# Patient Record
Sex: Female | Born: 1975
Health system: Southern US, Community
[De-identification: ages and names within clinical notes are randomized; demographics above are authoritative.]

## PROBLEM LIST (undated history)

## (undated) DIAGNOSIS — I7300 Raynaud's syndrome without gangrene: Secondary | ICD-10-CM

## (undated) DIAGNOSIS — K219 Gastro-esophageal reflux disease without esophagitis: Secondary | ICD-10-CM

## (undated) DIAGNOSIS — J301 Allergic rhinitis due to pollen: Secondary | ICD-10-CM

## (undated) DIAGNOSIS — M419 Scoliosis, unspecified: Secondary | ICD-10-CM

## (undated) DIAGNOSIS — B019 Varicella without complication: Secondary | ICD-10-CM

## (undated) HISTORY — DX: Varicella without complication: B01.9

## (undated) HISTORY — DX: Allergic rhinitis due to pollen: J30.1

## (undated) HISTORY — DX: Raynaud's syndrome without gangrene: I73.00

## (undated) HISTORY — DX: Scoliosis, unspecified: M41.9

## (undated) HISTORY — DX: Gastro-esophageal reflux disease without esophagitis: K21.9

---

## 2007-10-31 HISTORY — PX: NASAL SEPTUM SURGERY: SHX37

## 2018-10-29 ENCOUNTER — Ambulatory Visit: Payer: 59 | Admitting: Family Medicine

## 2018-10-29 ENCOUNTER — Encounter: Payer: Self-pay | Admitting: Family Medicine

## 2018-10-29 VITALS — BP 128/86 | HR 85 | Temp 98.0°F | Ht 61.0 in | Wt 192.4 lb

## 2018-10-29 DIAGNOSIS — Z8349 Family history of other endocrine, nutritional and metabolic diseases: Secondary | ICD-10-CM | POA: Diagnosis not present

## 2018-10-29 DIAGNOSIS — Z0000 Encounter for general adult medical examination without abnormal findings: Secondary | ICD-10-CM | POA: Diagnosis not present

## 2018-10-29 DIAGNOSIS — E6609 Other obesity due to excess calories: Secondary | ICD-10-CM

## 2018-10-29 DIAGNOSIS — Z6836 Body mass index (BMI) 36.0-36.9, adult: Secondary | ICD-10-CM | POA: Insufficient documentation

## 2018-10-29 DIAGNOSIS — E559 Vitamin D deficiency, unspecified: Secondary | ICD-10-CM | POA: Diagnosis not present

## 2018-10-29 NOTE — Progress Notes (Signed)
Subjective:    Patient ID: Gloria SchatzLucinda Dorsey, female    DOB: 10/09/1976, 42 y.o.   MRN: [id number]  HPI  Presents to clinic to establish with PCP.  Overall she is feeling well. She has history of vit D deficiency and would like this level checked  Mammogram is UTD and Pap smear is UTD - she sees GYN  Also states her GYN started her on phentermine to help jumpstart weight loss, she just picked up Rx yesterday and will start taking after the new year.   She sees the eye doctor once every 1 to 2 years, she sees dentist every 6 months.  Currently other than the phentermine she also takes a woman's One-A-Day vitamin and vitamin D3 2000 units daily during the winter months and 1000 units daily during the summer months.  Past medical, surgical, family and social history reviewed and updated in chart: Past Medical History:  Diagnosis Date  . Chicken pox   . GERD (gastroesophageal reflux disease)   . Hay fever    Social History   Tobacco Use  . Smoking status: Former Games developermoker  . Smokeless tobacco: Never Used  Substance Use Topics  . Alcohol use: Never    Frequency: Never   Family History  Problem Relation Age of Onset  . Arthritis Mother   . Asthma Mother   . Diabetes Mother   . Heart disease Mother   . Hypertension Mother   . Arthritis Father   . Heart disease Father   . Hyperlipidemia Father   . Stroke Father   . Alcohol abuse Brother   . Cancer Brother   . COPD Brother   . Arthritis Maternal Grandmother   . Cancer Maternal Grandmother   . Depression Maternal Grandmother   . Diabetes Maternal Grandmother   . Heart disease Maternal Grandmother   . Hyperlipidemia Maternal Grandmother   . Hypertension Maternal Grandmother   . Kidney disease Maternal Grandmother   . Stroke Maternal Grandfather   . Heart disease Paternal Grandmother   . Cancer Paternal Grandfather    Past Surgical History:  Procedure Laterality Date  . NASAL SEPTUM SURGERY  2009    Review of  Systems  Constitutional: Negative for chills, fatigue and fever.  HENT: Negative for congestion, ear pain, sinus pain and sore throat.   Eyes: Negative.   Respiratory: Negative for cough, shortness of breath and wheezing.   Cardiovascular: Negative for chest pain, palpitations and leg swelling.  Gastrointestinal: Negative for abdominal pain, diarrhea, nausea and vomiting.  Genitourinary: Negative for dysuria, frequency and urgency.  Musculoskeletal: Negative for arthralgias and myalgias.  Skin: Negative for color change, pallor and rash.  Neurological: Negative for syncope, light-headedness and headaches.  Psychiatric/Behavioral: The patient is not nervous/anxious.        Objective:   Physical Exam  Constitutional: She appears well-developed and well-nourished. No distress.  HENT:  Head: Normocephalic and atraumatic.  Eyes: Pupils are equal, round, and reactive to light. EOM are normal. No scleral icterus.  Neck: Normal range of motion. Neck supple. No tracheal deviation present.  Cardiovascular: Normal rate, regular rhythm and normal heart sounds.  Pulmonary/Chest: Effort normal and breath sounds normal. No respiratory distress. She has no wheezes. She has no rales.  Abdominal: Soft. Bowel sounds are normal. There is no tenderness.  Neurological: She is alert and oriented to person, place, and time.  Gait normal  Skin: Skin is warm and dry. No pallor.  Psychiatric: She has a normal mood and affect.  Her behavior is normal. Thought content normal.   Nursing note and vitals reviewed.   Today's Vitals   10/29/18 1031  BP: 128/86  Pulse: 85  Temp: 98 F (36.7 C)  TempSrc: Oral  SpO2: 97%  Weight: 192 lb 6.4 oz (87.3 kg)  Height: 5\' 1"  (1.549 m)   Body mass index is 36.35 kg/m.     Assessment & Plan:   Well adult exam/obesity/family history of metabolic and nutritional disorder - patient's Pap smear mammogram up-to-date.  She is working on healthy eating and weight loss  with the help of phentermine, phentermine is prescribed by her GYN.  Also due to her family history of diabetes, heart disease, hyperlipidemia we will be sure to check CBC, CMP, thyroid panel, lipid panel in lab work today.  Vitamin D deficiency - patient has longstanding history of vitamin D deficiency.  Will check vitamin D in clinic today as well as B12 level.  Patient will follow-up here in 1 year for annual exam.  She will be made aware of her lab results when they are available, if lab results require any additional medications or future follow-up those orders will be placed accordingly.  Patient's weight loss and phentermine prescription is managed by her GYN.

## 2018-10-30 LAB — COMPREHENSIVE METABOLIC PANEL
AG Ratio: 2 (calc) (ref 1.0–2.5)
ALT: 14 U/L (ref 6–29)
AST: 11 U/L (ref 10–30)
Albumin: 4.5 g/dL (ref 3.6–5.1)
Alkaline phosphatase (APISO): 69 U/L (ref 33–115)
BUN: 8 mg/dL (ref 7–25)
CO2: 27 mmol/L (ref 20–32)
CREATININE: 0.7 mg/dL (ref 0.50–1.10)
Calcium: 9.7 mg/dL (ref 8.6–10.2)
Chloride: 100 mmol/L (ref 98–110)
GLUCOSE: 91 mg/dL (ref 65–99)
Globulin: 2.2 g/dL (calc) (ref 1.9–3.7)
Potassium: 4.5 mmol/L (ref 3.5–5.3)
Sodium: 137 mmol/L (ref 135–146)
TOTAL PROTEIN: 6.7 g/dL (ref 6.1–8.1)
Total Bilirubin: 0.4 mg/dL (ref 0.2–1.2)

## 2018-10-30 LAB — LIPID PANEL
Cholesterol: 221 mg/dL — ABNORMAL HIGH (ref <200)
HDL: 60 mg/dL (ref >50)
LDL Cholesterol (Calc): 127 mg/dL (calc) — ABNORMAL HIGH
NON-HDL CHOLESTEROL (CALC): 161 mg/dL — AB (ref <130)
TRIGLYCERIDES: 204 mg/dL — AB (ref <150)
Total CHOL/HDL Ratio: 3.7 (calc) (ref <5.0)

## 2018-10-30 LAB — B12 AND FOLATE PANEL
Folate: 10.8 ng/mL
Vitamin B-12: 449 pg/mL (ref 200–1100)

## 2018-10-30 LAB — VITAMIN D 25 HYDROXY (VIT D DEFICIENCY, FRACTURES): Vit D, 25-Hydroxy: 31 ng/mL (ref 30–100)

## 2018-10-30 LAB — THYROID PANEL WITH TSH
Free Thyroxine Index: 2.2 (ref 1.4–3.8)
T3 Uptake: 28 % (ref 22–35)
T4, Total: 7.8 ug/dL (ref 5.1–11.9)
TSH: 2.31 mIU/L

## 2018-10-30 LAB — CBC
HCT: 43 % (ref 35.0–45.0)
Hemoglobin: 15 g/dL (ref 11.7–15.5)
MCH: 32.7 pg (ref 27.0–33.0)
MCHC: 34.9 g/dL (ref 32.0–36.0)
MCV: 93.7 fL (ref 80.0–100.0)
MPV: 8.9 fL (ref 7.5–12.5)
PLATELETS: 293 10*3/uL (ref 140–400)
RBC: 4.59 10*6/uL (ref 3.80–5.10)
RDW: 11.8 % (ref 11.0–15.0)
WBC: 11.7 10*3/uL — ABNORMAL HIGH (ref 3.8–10.8)

## 2019-05-29 ENCOUNTER — Ambulatory Visit (INDEPENDENT_AMBULATORY_CARE_PROVIDER_SITE_OTHER): Payer: 59 | Admitting: Family Medicine

## 2019-05-29 ENCOUNTER — Other Ambulatory Visit: Payer: Self-pay

## 2019-05-29 DIAGNOSIS — R5383 Other fatigue: Secondary | ICD-10-CM | POA: Diagnosis not present

## 2019-05-29 DIAGNOSIS — H6690 Otitis media, unspecified, unspecified ear: Secondary | ICD-10-CM | POA: Diagnosis not present

## 2019-05-29 DIAGNOSIS — R509 Fever, unspecified: Secondary | ICD-10-CM | POA: Diagnosis not present

## 2019-05-29 DIAGNOSIS — Z20822 Contact with and (suspected) exposure to covid-19: Secondary | ICD-10-CM

## 2019-05-29 DIAGNOSIS — R05 Cough: Secondary | ICD-10-CM | POA: Diagnosis not present

## 2019-05-29 DIAGNOSIS — Z20828 Contact with and (suspected) exposure to other viral communicable diseases: Secondary | ICD-10-CM

## 2019-05-29 DIAGNOSIS — R059 Cough, unspecified: Secondary | ICD-10-CM

## 2019-05-29 MED ORDER — AMOXICILLIN-POT CLAVULANATE 875-125 MG PO TABS: 1.0000 | ORAL_TABLET | Freq: Two times a day (BID) | ORAL | 0 refills | Status: DC

## 2019-05-29 NOTE — Progress Notes (Signed)
Patient ID: Gloria Dorsey, female   DOB: [birth year], 43 y.o.   MRN: [id number]    Virtual Visit via video Note  This visit type was conducted due to national recommendations for restrictions regarding the COVID-19 pandemic (e.g. social distancing).  This format is felt to be most appropriate for this patient at this time.  All issues noted in this document were discussed and addressed.  No physical exam was performed (except for noted visual exam findings with Video Visits).   I connected with Gloria Dorsey today at  2:20 PM EDT by a video enabled telemedicine application and verified that I am speaking with the correct person using two identifiers. Location patient: home Location provider: work or home office Persons participating in the virtual visit: patient, provider  I discussed the limitations, risks, security and privacy concerns of performing an evaluation and management service by video and the availability of in person appointments. I also discussed with the patient that there may be a patient responsible charge related to this service. The patient expressed understanding and agreed to proceed.  HPI:  Patient and I connected via video due to ear pain and positive close exposure to COVID-19.  Patient was tested for COVID-19 yesterday due to her husband testing positive.  Also has had pain in ear building up over the past 7 to 10 days.  Usually tries to combat this with allergy medication, has a history of seasonal allergies and ear pain.  Has been taking Allegra and some Sudafed with minimal help in reducing ear pain.  Now ears feel throbbing and "as if they could burst".  Yesterday and today, patient also began to develop fatigue, feeling achy and feverish/chills.  Denies SOB or wheezing, no chest pain, denies N/V/D    ROS: See pertinent positives and negatives per HPI.  Past Medical History:  Diagnosis Date   Chicken pox    GERD (gastroesophageal reflux disease)    Hay  fever     Past Surgical History:  Procedure Laterality Date   NASAL SEPTUM SURGERY  2009    Family History  Problem Relation Age of Onset   Arthritis Mother    Asthma Mother    Diabetes Mother    Heart disease Mother    Hypertension Mother    Arthritis Father    Heart disease Father    Hyperlipidemia Father    Stroke Father    Alcohol abuse Brother    Cancer Brother    COPD Brother    Arthritis Maternal Grandmother    Cancer Maternal Grandmother    Depression Maternal Grandmother    Diabetes Maternal Grandmother    Heart disease Maternal Grandmother    Hyperlipidemia Maternal Grandmother    Hypertension Maternal Grandmother    Kidney disease Maternal Grandmother    Stroke Maternal Grandfather    Heart disease Paternal Grandmother    Cancer Paternal Grandfather      EXAM:  GENERAL: alert, oriented, appears in no acute distress; does seem tired.  HEENT: atraumatic, conjunttiva clear, no obvious abnormalities on inspection of external nose and ears  NECK: normal movements of the head and neck  LUNGS: on inspection no signs of respiratory distress, breathing rate appears normal, no obvious gross SOB, gasping or wheezing  CV: no obvious cyanosis  MS: moves all visible extremities without noticeable abnormality  PSYCH/NEURO: pleasant and cooperative, no obvious depression or anxiety, speech and thought processing grossly intact  ASSESSMENT AND PLAN:  Discussed the following assessment and plan:  +COVID-19  virus infection exposure, fever, chills, fatigue, cough - advised that due to symptoms +close exposure, we need to get patient set up for COVID-19 testing.  Patient advised that I will put order in and he can go to testing location for for drive-through testing. Patient given the address of testing site.  Patient advised that testing is taking 2 to 7 days to result, and while we are awaiting results patient must remain under self quarantine  and monitor for any changing/worsening symptoms.  Advised over-the-counter medications such as Tylenol can be used to help treat pain or fevers, Robitussin can be used to help calm cough, allergy medication such as Claritin or Allegra can help reduce congestion.  Also discussed getting plenty of rest and increasing fluid intake.  Made patient aware that test results as well as how his symptoms progress will determine when the self quarantine will be able to end.  Also advised to monitor self for any worsening symptoms, advised if severe shortness of breath develops, high fever that is not reduced with use of Tylenol, chest pain, severe vomiting or diarrhea  --patient must call on-call and or go to ER right away for evaluation. patient verbalized understanding of these instructions.  Ear pain/infection -due to patient's description of ear pain and concern for possible ear infection.  Advised she can continue allergy medicine that she has been and also we will treat with Augmentin twice daily for 10 days.  Advised that this medication can be tough on this medicine to be sure to eat with food and also eat yogurt and/or daily probiotic to help offset/antibiotic associated gastritis and diarrhea that can occur.   I discussed the assessment and treatment plan with the patient. The patient was provided an opportunity to ask questions and all were answered. The patient agreed with the plan and demonstrated an understanding of the instructions.   The patient was advised to call back or seek an in-person evaluation if the symptoms worsen or if the condition fails to improve as anticipated.  Tracey HarriesLauren M Prinston Kynard, FNP

## 2019-06-01 ENCOUNTER — Telehealth: Payer: Self-pay | Admitting: Family Medicine

## 2019-06-01 LAB — NOVEL CORONAVIRUS, NAA: SARS-CoV-2, NAA: DETECTED — AB

## 2019-06-01 NOTE — Telephone Encounter (Signed)
I was notified today by [facility] that here recent test for the Covid-19 virus was positive. Her husband tested positive last week and no doubt this was the source of her infection. She began to have symptoms on 05-28-19 including headache, body aches, fever to 99. 7 degrees, and nausea and vomiting. She is drinking fluids and she has used some Zofran and Tylenol #3 which she had left over from a dental procedure. Today she feels about the same. She knows to self quarantine for a total of 14 days from the start of her symptoms. She has already spoken to her PCP, Philis Nettle, about this and she has provided her with a work. 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 She will check with her supervisor about this.

## 2019-06-02 ENCOUNTER — Encounter: Payer: Self-pay | Admitting: *Deleted

## 2019-06-02 ENCOUNTER — Telehealth: Payer: Self-pay | Admitting: *Deleted

## 2019-06-02 NOTE — Telephone Encounter (Signed)
-----   Message from [name], FNP sent at 06/02/2019  8:08 AM EDT ----- Gloria Dorsey,  Patient aware of her results -- forwarding to you for health dept notification

## 2019-06-02 NOTE — Telephone Encounter (Signed)
Mailed patient Home isolation information and log to verify people in/out of home. Notified [facility] of positive COVID case.

## 2019-06-04 ENCOUNTER — Other Ambulatory Visit: Payer: Self-pay

## 2019-06-04 ENCOUNTER — Encounter: Payer: Self-pay | Admitting: Emergency Medicine

## 2019-06-04 ENCOUNTER — Emergency Department
Admission: EM | Admit: 2019-06-04 | Discharge: 2019-06-05 | Disposition: A | Discharge status: Home / Self Care | Payer: 59 | Attending: Emergency Medicine | Admitting: Emergency Medicine

## 2019-06-04 ENCOUNTER — Emergency Department: Payer: 59

## 2019-06-04 DIAGNOSIS — R509 Fever, unspecified: Secondary | ICD-10-CM | POA: Diagnosis present

## 2019-06-04 DIAGNOSIS — Z87891 Personal history of nicotine dependence: Secondary | ICD-10-CM | POA: Insufficient documentation

## 2019-06-04 DIAGNOSIS — E876 Hypokalemia: Secondary | ICD-10-CM | POA: Diagnosis not present

## 2019-06-04 DIAGNOSIS — U071 COVID-19: Secondary | ICD-10-CM | POA: Diagnosis not present

## 2019-06-04 LAB — CBC WITH DIFFERENTIAL/PLATELET
Abs Immature Granulocytes: 0.03 10*3/uL (ref 0.00–0.07)
Basophils Absolute: 0 10*3/uL (ref 0.0–0.1)
Basophils Relative: 0 %
Eosinophils Absolute: 0 10*3/uL (ref 0.0–0.5)
Eosinophils Relative: 0 %
HCT: 42.3 % (ref 36.0–46.0)
Hemoglobin: 14.6 g/dL (ref 12.0–15.0)
Immature Granulocytes: 0 %
Lymphocytes Relative: 13 %
Lymphs Abs: 1.1 10*3/uL (ref 0.7–4.0)
MCH: 31.5 pg (ref 26.0–34.0)
MCHC: 34.5 g/dL (ref 30.0–36.0)
MCV: 91.4 fL (ref 80.0–100.0)
Monocytes Absolute: 0.2 10*3/uL (ref 0.1–1.0)
Monocytes Relative: 3 %
Neutro Abs: 7.5 10*3/uL (ref 1.7–7.7)
Neutrophils Relative %: 84 %
Platelets: 185 10*3/uL (ref 150–400)
RBC: 4.63 MIL/uL (ref 3.87–5.11)
RDW: 11.4 % — ABNORMAL LOW (ref 11.5–15.5)
WBC: 8.9 10*3/uL (ref 4.0–10.5)
nRBC: 0 % (ref 0.0–0.2)

## 2019-06-04 LAB — PREGNANCY, URINE: Preg Test, Ur: NEGATIVE

## 2019-06-04 LAB — BASIC METABOLIC PANEL
Anion gap: 11 (ref 5–15)
BUN: 9 mg/dL (ref 6–20)
CO2: 24 mmol/L (ref 22–32)
Calcium: 8.2 mg/dL — ABNORMAL LOW (ref 8.9–10.3)
Chloride: 102 mmol/L (ref 98–111)
Creatinine, Ser: 0.61 mg/dL (ref 0.44–1.00)
GFR calc Af Amer: >60 mL/min (ref >60)
GFR calc non Af Amer: >60 mL/min (ref >60)
Glucose, Bld: 125 mg/dL — ABNORMAL HIGH (ref 70–99)
Potassium: 2.8 mmol/L — ABNORMAL LOW (ref 3.5–5.1)
Sodium: 137 mmol/L (ref 135–145)

## 2019-06-04 LAB — MAGNESIUM: Magnesium: 1.9 mg/dL (ref 1.7–2.4)

## 2019-06-04 MED ORDER — SODIUM CHLORIDE 0.9 % IV BOLUS
1000.0000 mL | Freq: Once | INTRAVENOUS | Status: AC
  Administered 2019-06-04: 1000 mL via INTRAVENOUS

## 2019-06-04 MED ORDER — KETOROLAC TROMETHAMINE 30 MG/ML IJ SOLN
30.0000 mg | Freq: Once | INTRAMUSCULAR | Status: AC
  Administered 2019-06-04: 30 mg via INTRAVENOUS
  Filled 2019-06-04: qty 1

## 2019-06-04 MED ORDER — POTASSIUM CHLORIDE ER 10 MEQ PO TBCR: 20.0000 meq | EXTENDED_RELEASE_TABLET | Freq: Every day | ORAL | 0 refills | Status: DC

## 2019-06-04 MED ORDER — POTASSIUM CHLORIDE CRYS ER 20 MEQ PO TBCR
40.0000 meq | EXTENDED_RELEASE_TABLET | Freq: Once | ORAL | Status: AC
  Administered 2019-06-04: 40 meq via ORAL

## 2019-06-04 MED ORDER — ONDANSETRON HCL 4 MG/2ML IJ SOLN
4.0000 mg | Freq: Once | INTRAMUSCULAR | Status: AC
  Administered 2019-06-04: 4 mg via INTRAVENOUS
  Filled 2019-06-04: qty 2

## 2019-06-04 MED ORDER — POTASSIUM CHLORIDE 10 MEQ/100ML IV SOLN
10.0000 meq | INTRAVENOUS | Status: AC
  Administered 2019-06-04 – 2019-06-05 (×2): 10 meq via INTRAVENOUS
  Filled 2019-06-04 (×2): qty 100

## 2019-06-04 NOTE — ED Provider Notes (Signed)
[facility] Emergency Department Provider Note  ____________________________________________   First MD Initiated Contact with Patient 06/04/19 2112     (approximate)  I have reviewed the triage vital signs and the nursing notes.   HISTORY  Chief Complaint Fever, Cough, and Generalized Body Aches    HPI Gloria Dorsey is a 43 y.o. female with acid reflux who presents with fever and cough.  Patient was recently diagnosed with coronavirus on Saturday, 5 days ago.  Patient reports worsening fever, body aches, muscle spasms and frequent productive coughing.  Patient versus having some urinary incontinence with coughing.  Does not day 7 of symptoms.  Patient has been taking Tylenol, Zofran, Phenergan but still having symptoms.  She presented today to make sure that she was extremely dehydrated given she is not eaten as well for the past 2 days.  This is been constant, not better with home medications, nothing makes it worse.  Patient is currently on Augmentin for her ear ache.    Past Medical History:  Diagnosis Date  . Chicken pox   . GERD (gastroesophageal reflux disease)   . Hay fever     Patient Active Problem List   Diagnosis Date Noted  . Class 2 obesity due to excess calories without serious comorbidity with body mass index (BMI) of 36.0 to 36.9 in adult 10/29/2018  . Vitamin D deficiency 10/29/2018  . Family history of metabolic and nutritional disorder 10/29/2018    Past Surgical History:  Procedure Laterality Date  . NASAL SEPTUM SURGERY  2009    Prior to Admission medications   Medication Sig Start Date End Date Taking? Authorizing Provider  amoxicillin-clavulanate (AUGMENTIN) 875-125 MG tablet Take 1 tablet by mouth 2 (two) times daily. 05/29/19   Tracey HarriesGuse, Lauren M, FNP    Allergies Tramadol  Family History  Problem Relation Age of Onset  . Arthritis Mother   . Asthma Mother   . Diabetes Mother   . Heart disease Mother   .  Hypertension Mother   . Arthritis Father   . Heart disease Father   . Hyperlipidemia Father   . Stroke Father   . Alcohol abuse Brother   . Cancer Brother   . COPD Brother   . Arthritis Maternal Grandmother   . Cancer Maternal Grandmother   . Depression Maternal Grandmother   . Diabetes Maternal Grandmother   . Heart disease Maternal Grandmother   . Hyperlipidemia Maternal Grandmother   . Hypertension Maternal Grandmother   . Kidney disease Maternal Grandmother   . Stroke Maternal Grandfather   . Heart disease Paternal Grandmother   . Cancer Paternal Grandfather     Social History Social History   Tobacco Use  . Smoking status: Former Games developermoker  . Smokeless tobacco: Never Used  Substance Use Topics  . Alcohol use: Never    Frequency: Never  . Drug use: Never      Review of Systems Constitutional: Positive fevers, positive weakness Eyes: No visual changes. ENT: No sore throat.  Positive ear pain Cardiovascular: No chest pain Respiratory: Positive for SOB Gastrointestinal: No abdominal pain.  Positive nausea, positive decreased p.o. intake Genitourinary: Negative for dysuria. Musculoskeletal: Negative for back pain. Skin: Negative for rash. Neurological: Negative for headaches, focal weakness or numbness. All other ROS negative ____________________________________________   PHYSICAL EXAM:  VITAL SIGNS: ED Triage Vitals  Enc Vitals Group     BP 06/04/19 2101 103/68     Pulse Rate 06/04/19 2101 (!) 107  Resp 06/04/19 2101 (!) 22     Temp 06/04/19 2101 98.4 F (36.9 C)     Temp Source 06/04/19 2101 Oral     SpO2 06/04/19 2101 96 %     Weight 06/04/19 2107 180 lb (81.6 kg)     Height 06/04/19 2107 5\' 1"  (1.549 m)     Head Circumference --      Peak Flow --      Pain Score 06/04/19 2107 4     Pain Loc --      Pain Edu? --      Excl. in [place]? --     Constitutional: Alert and oriented. Well appearing and in no acute distress. Eyes: Conjunctivae are  normal. EOMI. Head: Atraumatic.  TMs clear bilaterally Nose: No congestion/rhinnorhea. Mouth/Throat: Mucous membranes are moist.   Neck: No stridor. Trachea Midline. FROM Cardiovascular: Tachycardic, regular rhythm. Grossly normal heart sounds.  Good peripheral circulation. Respiratory: No increased work of breathing, no stridor Gastrointestinal: Soft and nontender. No distention. No abdominal bruits.  Musculoskeletal: No lower extremity tenderness nor edema.  No joint effusions. Neurologic:  Normal speech and language. No gross focal neurologic deficits are appreciated.  Skin:  Skin is warm, dry and intact. No rash noted. Psychiatric: Mood and affect are normal. Speech and behavior are normal. GU: Deferred   ____________________________________________   LABS (all labs ordered are listed, but only abnormal results are displayed)  Labs Reviewed  CBC WITH DIFFERENTIAL/PLATELET - Abnormal; Notable for the following components:      Result Value   RDW 11.4 (*)    All other components within normal limits  BASIC METABOLIC PANEL - Abnormal; Notable for the following components:   Potassium 2.8 (*)    Glucose, Bld 125 (*)    Calcium 8.2 (*)    All other components within normal limits  MAGNESIUM  PREGNANCY, URINE  URINALYSIS, ROUTINE W REFLEX MICROSCOPIC   ____________________________________________   ED ECG REPORT I, Vanessa Marathon, the attending physician, personally viewed and interpreted this ECG.  EKG is sinus rate of 92, no ST elevation T wave inversion in leads II, III, aVF, normal intervals no prior EKG to compare to ____________________________________________  RADIOLOGY I, Vanessa Bigelow, personally viewed and evaluated these images (plain radiographs) as part of my medical decision making, as well as reviewing the written report by the radiologist.  ED MD interpretation: X-ray is concerning for atelectasis versus pneumonia.  Official radiology report(s): Dg Chest 1  View  Result Date: 06/04/2019 CLINICAL DATA:  Short of breath.  COVID-19 positive EXAM: CHEST  1 VIEW COMPARISON:  None. FINDINGS: Mild right lower lobe airspace disease possible pneumonia. No effusion. Left lung clear. Heart size normal. Scoliosis. IMPRESSION: Mild right lower lobe airspace disease. Possible atelectasis or pneumonia. Electronically Signed   By: Franchot Gallo M.D.   On: 06/04/2019 21:54    ____________________________________________  INITIAL IMPRESSION / ASSESSMENT AND PLAN / ED COURSE   Ariyanah Aguado was evaluated in Emergency Department on 06/04/2019 for the symptoms described in the history of present illness. She was evaluated in the context of the global COVID-19 pandemic, which necessitated consideration that the patient might be at risk for infection with the SARS-CoV-2 virus that causes COVID-19. Institutional protocols and algorithms that pertain to the evaluation of patients at risk for COVID-19 are in a state of rapid change based on information released by regulatory bodies including the CDC and federal and state organizations. These policies and algorithms were followed during  the patient's care in the ED.     Patient presents with multiple symptoms is most consistent with coronavirus.  Will get labs evaluate for electrolyte abnormalities or dehydration.  No evidence of Otitis media based upon examination patient is already on Augmentin.  PNA-will get xray to evaluation Anemia-CBC to evaluate ACS-no risk factors for ACS not really having chest pain.  Will get EKG though. Arrhythmia-Will get EKG and keep on monitor.  PE-lower suspicion given no risk factors and tachycardia is more likely secondary to the decreased p.o. intake.    Patient x-ray was concerning for possible pneumonia however patient has no white count and is not febrile.  It is mostly secondary to coronavirus.  Potassium is slightly low at 2.8 therefore will replete this.  Magnesium was normal.  Urine  without evidence of UTI      Patient given 20 of IV potassium and 40 of p.o.  Will send home with 20 of p.o. potassium for the next 3 days.  Patient handed off to the oncoming team pending this.  Discussed with patient she does not require oxygen and she is able to tolerate p.o. she will most likely be able to go home.  Patient feels comfortable with this plan.  I discussed the provisional nature of ED diagnosis, the treatment so far, the ongoing plan of care, follow up appointments and return precautions with the patient and any family or support people present. They expressed understanding and agreed with the plan, discharged home.          ____________________________________________   FINAL CLINICAL IMPRESSION(S) / ED DIAGNOSES   Final diagnoses:  COVID-19  Hypokalemia     MEDICATIONS GIVEN DURING THIS VISIT:  Medications  potassium chloride 10 mEq in 100 mL IVPB (has no administration in time range)  sodium chloride 0.9 % bolus 1,000 mL (1,000 mLs Intravenous New Bag/Given 06/04/19 2148)  ketorolac (TORADOL) 30 MG/ML injection 30 mg (30 mg Intravenous Given 06/04/19 2324)  potassium chloride SA (K-DUR) CR tablet 40 mEq (40 mEq Oral Given 06/04/19 2325)  ondansetron (ZOFRAN) injection 4 mg (4 mg Intravenous Given 06/04/19 2323)     ED Discharge Orders         Ordered    potassium chloride (K-DUR) 10 MEQ tablet  Daily     06/04/19 2335           Note:  This document was prepared using Dragon voice recognition software and may include unintentional dictation errors.   Concha SeFunke, Egon Dittus E, MD 06/04/19 [phone]

## 2019-06-04 NOTE — ED Notes (Signed)
Pt agrees to check ambulatory sat post 1st potassium bag.

## 2019-06-04 NOTE — ED Notes (Signed)
Pt placed on 2L O2 via [place] d/t 93% RA sat with good waveform.

## 2019-06-04 NOTE — ED Notes (Signed)
Pt given warm blankets.

## 2019-06-04 NOTE — ED Notes (Signed)
EKG completed

## 2019-06-04 NOTE — ED Notes (Signed)
EDP Funke verbal to remove 2L O2; verbal to start back on 2L if pt desat 88% or below.

## 2019-06-04 NOTE — ED Triage Notes (Signed)
Pt presents to ED with worsening symptoms after she was recently dx with COVID on Saturday. Pt reports worsening fever, body aches, muscle spasms, and frequent productive cough. Pt reports having urinary  incontinence when coughing/vomiting. Started felling sick on Thursday and pt reports cough didn't start until Monday. otc medications do not seem to be helping.

## 2019-06-04 NOTE — Discharge Instructions (Addendum)
Your potassium was low.  You should take 3 days worth of potassium pills.  You should return for increased shortness of breath.

## 2019-06-05 MED ORDER — SODIUM CHLORIDE 0.9 % IV SOLN
Freq: Once | INTRAVENOUS | Status: AC
  Administered 2019-06-05: 01:00:00 via INTRAVENOUS

## 2019-06-05 MED ORDER — AZITHROMYCIN 500 MG PO TABS
500.0000 mg | ORAL_TABLET | Freq: Once | ORAL | Status: AC
  Administered 2019-06-05: 500 mg via ORAL
  Filled 2019-06-05: qty 1

## 2019-06-05 MED ORDER — BENZONATATE 100 MG PO CAPS
100.0000 mg | ORAL_CAPSULE | Freq: Once | ORAL | Status: AC
  Administered 2019-06-05: 100 mg via ORAL
  Filled 2019-06-05: qty 1

## 2019-06-05 MED ORDER — ACETAMINOPHEN 325 MG PO TABS
ORAL_TABLET | ORAL | Status: AC
  Administered 2019-06-05: 650 mg via ORAL
  Filled 2019-06-05: qty 2

## 2019-06-05 MED ORDER — ACETAMINOPHEN 325 MG PO TABS
650.0000 mg | ORAL_TABLET | Freq: Once | ORAL | Status: AC
  Administered 2019-06-05: 650 mg via ORAL

## 2019-06-05 MED ORDER — ONDANSETRON HCL 4 MG/2ML IJ SOLN
4.0000 mg | Freq: Once | INTRAMUSCULAR | Status: AC
  Administered 2019-06-05: 4 mg via INTRAVENOUS

## 2019-06-05 MED ORDER — ONDANSETRON HCL 4 MG/2ML IJ SOLN
INTRAMUSCULAR | Status: AC
  Filled 2019-06-05: qty 2

## 2019-06-05 NOTE — ED Notes (Signed)
Pt coughing so hard it nearly makes her vomit; pt continues to c/o L ear pain. EDP Funke did not see fluid or irritation of ear upon examination earlier. Pt states nausea dec from earlier. HA unchanged per pt d/t cough.

## 2019-06-05 NOTE — ED Notes (Signed)
Pt actively vomiting. Emesis bag given.

## 2019-06-05 NOTE — ED Notes (Signed)
Lights dimmed for pt as she has a mild HA.

## 2019-06-05 NOTE — ED Notes (Signed)
Verbal okay from [place] to run 250cc bag of NS with rest of potassium IV as pt cannot tolerate burning sensation even at dec rate.

## 2019-06-05 NOTE — ED Notes (Signed)
Pt desat to 93% while walking in room. Steady on feet.

## 2019-06-05 NOTE — ED Notes (Signed)
1st bag of potassium almost complete; had to dec rate to 50 now as NS bolus finished and pt couldn't tolerate burning.

## 2019-06-11 ENCOUNTER — Encounter: Payer: Self-pay | Admitting: Family Medicine

## 2019-06-13 NOTE — Telephone Encounter (Signed)
Need some more info---   1st date she missed work  Is she back to work now? Or still out of work?  If still out currently -- how is she feeling?

## 2019-06-18 NOTE — Telephone Encounter (Signed)
Patient asking for copy of her paper work

## 2019-06-19 ENCOUNTER — Other Ambulatory Visit: Payer: Self-pay

## 2019-06-19 ENCOUNTER — Ambulatory Visit (INDEPENDENT_AMBULATORY_CARE_PROVIDER_SITE_OTHER): Payer: 59 | Admitting: Family Medicine

## 2019-06-19 DIAGNOSIS — R059 Cough, unspecified: Secondary | ICD-10-CM

## 2019-06-19 DIAGNOSIS — U071 COVID-19: Secondary | ICD-10-CM

## 2019-06-19 DIAGNOSIS — R110 Nausea: Secondary | ICD-10-CM

## 2019-06-19 DIAGNOSIS — J988 Other specified respiratory disorders: Secondary | ICD-10-CM | POA: Diagnosis not present

## 2019-06-19 DIAGNOSIS — R5383 Other fatigue: Secondary | ICD-10-CM

## 2019-06-19 DIAGNOSIS — R05 Cough: Secondary | ICD-10-CM

## 2019-06-19 MED ORDER — DOXYCYCLINE HYCLATE 100 MG PO TABS: 100.0000 mg | ORAL_TABLET | Freq: Two times a day (BID) | ORAL | 0 refills | Status: DC

## 2019-06-19 MED ORDER — ALBUTEROL SULFATE HFA 108 (90 BASE) MCG/ACT IN AERS: 2.0000 | INHALATION_SPRAY | Freq: Four times a day (QID) | RESPIRATORY_TRACT | 1 refills | Status: AC | PRN

## 2019-06-19 MED ORDER — MUCINEX DM 30-600 MG PO TB12: 2.0000 | ORAL_TABLET | Freq: Two times a day (BID) | ORAL | 1 refills | Status: DC

## 2019-06-19 NOTE — Progress Notes (Signed)
Patient ID: Gloria Dorsey Muraski, female   DOB: 03/28/1976, 43 y.o.   MRN: [id number]    Virtual Visit via video Note  This visit type was conducted due to national recommendations for restrictions regarding the COVID-19 pandemic (e.g. social distancing).  This format is felt to be most appropriate for this patient at this time.  All issues noted in this document were discussed and addressed.  No physical exam was performed (except for noted visual exam findings with Video Visits).   I connected with Gloria Dorsey Morell today at  3:40 PM EDT by a video enabled telemedicine application and verified that I am speaking with the correct person using two identifiers. Location patient: home Location provider: work or home office Persons participating in the virtual visit: patient, provider  I discussed the limitations, risks, security and privacy concerns of performing an evaluation and management service by video and the availability of in person appointments. I also discussed with the patient that there may be a patient responsible charge related to this service. The patient expressed understanding and agreed to proceed.  HPI:  Patient and I connected via video to follow-up on positive COVID-19 (+test on 05/29/2019) and continued episodes of cough, nausea and fatigue. She did go to ER on 06/04/2019 due to fatigue and dehydration concerns, was given IV fluids; CXR show possible RLL pneumonia but the XRAY read was thought to be more so related to coronavirus due to WBC count not being elevated. She finished Augmentin that was prescribed by me starting 05/29/2019. After going to ER, she was slowly feeling improved each day.   Patient was supposed to return to work today however worked about half of her shift and had to go home due to feeling extremely tired, having cough and feeling nauseous from coughing so much.  States she is not bringing up phlegm with cough, but cough is harsh and causes her upper abdomen to ache. No  vomiting or diarrhea. Thinks nausea feeling is from all the coughing. Is able to drink lots of fluids; drinks water/gatorade, eating soups.  Feels as if she could sleep for hours.  Denies any fevers or feelings of chills.  Denies wheezing or feeling short of breath.  Main issue is continued cough and feeling exhausted. States overall she feels "very much improved" but still not at her 100%; thinks she tried to return to work too soon.   ROS: See pertinent positives and negatives per HPI.  Past Medical History:  Diagnosis Date  . Chicken pox   . GERD (gastroesophageal reflux disease)   . Hay fever     Past Surgical History:  Procedure Laterality Date  . NASAL SEPTUM SURGERY  2009    Family History  Problem Relation Age of Onset  . Arthritis Mother   . Asthma Mother   . Diabetes Mother   . Heart disease Mother   . Hypertension Mother   . Arthritis Father   . Heart disease Father   . Hyperlipidemia Father   . Stroke Father   . Alcohol abuse Brother   . Cancer Brother   . COPD Brother   . Arthritis Maternal Grandmother   . Cancer Maternal Grandmother   . Depression Maternal Grandmother   . Diabetes Maternal Grandmother   . Heart disease Maternal Grandmother   . Hyperlipidemia Maternal Grandmother   . Hypertension Maternal Grandmother   . Kidney disease Maternal Grandmother   . Stroke Maternal Grandfather   . Heart disease Paternal Grandmother   . Cancer  Paternal Grandfather    Social History   Tobacco Use  . Smoking status: Former Research scientist (life sciences)  . Smokeless tobacco: Never Used  Substance Use Topics  . Alcohol use: Never    Frequency: Never    Current Outpatient Medications:  .  albuterol (VENTOLIN HFA) 108 (90 Base) MCG/ACT inhaler, Inhale 2 puffs into the lungs every 6 (six) hours as needed for wheezing or shortness of breath., Disp: 18 g, Rfl: 1 .  Dextromethorphan-guaiFENesin (MUCINEX DM) 30-600 MG TB12, Take 2 tablets by mouth 2 (two) times daily., Disp: 28 tablet, Rfl:  1 .  doxycycline (VIBRA-TABS) 100 MG tablet, Take 1 tablet (100 mg total) by mouth 2 (two) times daily., Disp: 20 tablet, Rfl: 0 .  potassium chloride (K-DUR) 10 MEQ tablet, Take 2 tablets (20 mEq total) by mouth daily for 3 days., Disp: 6 tablet, Rfl: 0  EXAM:  GENERAL: alert, oriented, appears well and in no acute distress  HEENT: atraumatic, conjunttiva clear, no obvious abnormalities on inspection of external nose and ears  NECK: normal movements of the head and neck  LUNGS: on inspection no signs of respiratory distress, breathing rate appears normal, no obvious gross SOB, gasping or wheezing  CV: no obvious cyanosis  MS: moves all visible extremities without noticeable abnormality  PSYCH/NEURO: pleasant and cooperative, no obvious depression or anxiety, speech and thought processing grossly intact  ASSESSMENT AND PLAN:  Discussed the following assessment and plan:  Positive COVID-19, respiratory infection, cough, nausea, fatigue - we will treat patient with doxycycline twice daily for 10 days for coverage of possible respiratory infection.  She will use albuterol inhaler as needed and has been advised to take at least 2 puffs twice per day on a scheduled basis for the next week & use PRN to help open lungs. She will also use Mucinex to help break up cough.  Patient already has Zofran at home as needed for nausea.  Advised to keep up good fluid intake with water, Gatorade, soups, broths, Jell-O and advised to eat bland foods like crackers, toast, dry scrambled eggs, rice. She will remain out of work for another week and we will tentatively plan for her to return to work on August 31st.  We will touch base again next week to follow-up on how she is feeling.  Advised that if her symptoms worsen in any way she must go to the emergency room for evaluation.  Patient verbalizes understanding.   I discussed the assessment and treatment plan with the patient. The patient was provided an  opportunity to ask questions and all were answered. The patient agreed with the plan and demonstrated an understanding of the instructions.   The patient was advised to call back or seek an in-person evaluation if the symptoms worsen or if the condition fails to improve as anticipated.   [name], FNP

## 2019-06-19 NOTE — Telephone Encounter (Signed)
Do you have copy of paper work that was already filled out so we can just update the date, and not have to re-do whole form?  Thanks  LG

## 2019-06-20 ENCOUNTER — Encounter: Payer: Self-pay | Admitting: Family Medicine

## 2019-06-20 DIAGNOSIS — R059 Cough, unspecified: Secondary | ICD-10-CM

## 2019-06-20 DIAGNOSIS — J988 Other specified respiratory disorders: Secondary | ICD-10-CM

## 2019-06-20 DIAGNOSIS — R05 Cough: Secondary | ICD-10-CM

## 2019-06-23 MED ORDER — AEROCHAMBER PLUS FLO-VU LARGE MISC: 0 refills | Status: AC

## 2019-11-03 ENCOUNTER — Encounter: Payer: 59 | Admitting: Family Medicine

## 2019-12-05 ENCOUNTER — Other Ambulatory Visit: Payer: Self-pay

## 2019-12-05 ENCOUNTER — Encounter: Payer: Self-pay | Admitting: Family

## 2019-12-05 ENCOUNTER — Ambulatory Visit (INDEPENDENT_AMBULATORY_CARE_PROVIDER_SITE_OTHER): Payer: 59 | Admitting: Family

## 2019-12-05 VITALS — Ht 61.0 in | Wt 193.0 lb

## 2019-12-05 DIAGNOSIS — U071 COVID-19: Secondary | ICD-10-CM

## 2019-12-05 DIAGNOSIS — F4321 Adjustment disorder with depressed mood: Secondary | ICD-10-CM | POA: Insufficient documentation

## 2019-12-05 DIAGNOSIS — R0602 Shortness of breath: Secondary | ICD-10-CM

## 2019-12-05 MED ORDER — BUPROPION HCL ER (XL) 150 MG PO TB24: ORAL_TABLET | ORAL | 3 refills | Status: DC

## 2019-12-05 NOTE — Progress Notes (Signed)
Virtual Visit via Video Note  I connected with@  on 12/05/19 at 11:00 AM EST by a video enabled telemedicine application and verified that I am speaking with the correct person using two identifiers.  Location patient: home Location provider:work  Persons participating in the virtual visit: patient, provider  I discussed the limitations of evaluation and management by telemedicine and the availability of in person appointments. The patient expressed understanding and agreed to proceed.   HPI: Establish care  Had covid 6 months ago and having residual cough, sob. Continues to have use ventolin inhaler, couple times per week. . Occasionally cough or wheeze when cold, will use inhaler more often.  Resolve with inhaler. Former smoker , Psychologist, forensic. NO fever, cp, leg swelling. Has nasal congestion today and just started sudafed.   Treated with augmentin, doxycycline.   Feels 'sad' with mother's lost and bothered by feeling sob. Occsaional trouble sleeping, staying asleep.   No si/hi.   Stays busy, no fatigue. Bothered by weight gain and had been on phentermine in the past. Has also gained weight, about 13 pounds.   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   No alcohol use.  No h/o seizure, eating disorder.   ROS: See pertinent positives and negatives per HPI.  Past Medical History:  Diagnosis Date  . Chicken pox   . GERD (gastroesophageal reflux disease)   . Hay fever     Past Surgical History:  Procedure Laterality Date  . NASAL SEPTUM SURGERY  2009    Family History  Problem Relation Age of Onset  . Arthritis Mother   . Asthma Mother   . Diabetes Mother   . Heart disease Mother   . Hypertension Mother   . Arthritis Father   . Heart disease Father   . Hyperlipidemia Father   . Stroke Father   . Alcohol abuse Brother   . Cancer Brother   . COPD Brother   . Arthritis Maternal Grandmother   . Cancer  Maternal Grandmother   . Depression Maternal Grandmother   . Diabetes Maternal Grandmother   . Heart disease Maternal Grandmother   . Hyperlipidemia Maternal Grandmother   . Hypertension Maternal Grandmother   . Kidney disease Maternal Grandmother   . Stroke Maternal Grandfather   . Heart disease Paternal Grandmother   . Cancer Paternal Grandfather     SOCIAL HX: former smoker   Current Outpatient Medications:  .  albuterol (VENTOLIN HFA) 108 (90 Base) MCG/ACT inhaler, Inhale 2 puffs into the lungs every 6 (six) hours as needed for wheezing or shortness of breath., Disp: 18 g, Rfl: 1 .  ascorbic acid (VITAMIN C) 250 MG CHEW, Chew 250 mg by mouth daily., Disp: , Rfl:  .  calcium carbonate (CALCIUM 600) 600 MG TABS tablet, Take 600 mg by mouth 2 (two) times daily with a meal., Disp: , Rfl:  .  Spacer/Aero-Holding Chambers (AEROCHAMBER PLUS FLO-VU LARGE) MISC, Use spacer with albuterol inhaler, Disp: 1 each, Rfl: 0 .  Vitamin D, Cholecalciferol, 50 MCG (2000 UT) CAPS, Take 1 tablet by mouth daily., Disp: , Rfl:  .  Zinc 50 MG TABS, Take 1 tablet by mouth daily., Disp: , Rfl:  .  buPROPion (WELLBUTRIN XL) 150 MG 24 hr tablet, Start 150 mg ER PO qam, increase after 3 days to 300 mg qam., Disp: 60 tablet, Rfl: 3  EXAM:  VITALS per patient if applicable:  At work: 135/93, HR 91. Sa02  98% with walking and sitting BP Readings from Last 3 Encounters:  06/05/19 (!) 98/59  10/29/18 128/86   Wt Readings from Last 3 Encounters:  12/05/19 193 lb (87.5 kg)  06/04/19 180 lb (81.6 kg)  10/29/18 192 lb 6.4 oz (87.3 kg)     GENERAL: alert, oriented, appears well and in no acute distress  HEENT: atraumatic, conjunttiva clear, no obvious abnormalities on inspection of external nose and ears  NECK: normal movements of the head and neck  LUNGS: on inspection no signs of respiratory distress, breathing rate appears normal, no obvious gross SOB, gasping or wheezing  CV: no obvious  cyanosis  MS: moves all visible extremities without noticeable abnormality  PSYCH/NEURO: pleasant and cooperative, no obvious depression or anxiety, speech and thought processing grossly intact  ASSESSMENT AND PLAN:  Discussed the following assessment and plan:  Grief reaction - Plan: buPROPion (WELLBUTRIN XL) 150 MG 24 hr tablet  Shortness of breath - Plan: CT Chest High Resolution  COVID-19 Problem List Items Addressed This Visit      Other   COVID-19    COVID Sequalae. No follow up after CXR, pending HR CT Chest. Will discuss with patient pulmonology referral after we get results of CT.       Grief reaction - Primary    Mother passed 08/2019. She declines counseling at this time. With weight gain, grief, agreed wellbutrin appropriate. Close f/u       Relevant Medications   buPROPion (WELLBUTRIN XL) 150 MG 24 hr tablet    Other Visit Diagnoses    Shortness of breath       Relevant Orders   CT Chest High Resolution      -we discussed possible serious and likely etiologies, options for evaluation and workup, limitations of telemedicine visit vs in person visit, treatment, treatment risks and precautions. Pt prefers to treat via telemedicine empirically rather then risking or undertaking an in person visit at this moment. Patient agrees to seek prompt in person care if worsening, new symptoms arise, or if is not improving with treatment.   I discussed the assessment and treatment plan with the patient. The patient was provided an opportunity to ask questions and all were answered. The patient agreed with the plan and demonstrated an understanding of the instructions.   The patient was advised to call back or seek an in-person evaluation if the symptoms worsen or if the condition fails to improve as anticipated.   Rennie Plowman, FNP

## 2019-12-05 NOTE — Assessment & Plan Note (Addendum)
Mother passed 08/2019. She declines counseling at this time. With weight gain, grief, agreed wellbutrin appropriate. Close f/u

## 2019-12-05 NOTE — Assessment & Plan Note (Signed)
COVID Sequalae. No follow up after CXR, pending HR CT Chest. Will discuss with patient pulmonology referral after we get results of CT.

## 2019-12-05 NOTE — Patient Instructions (Addendum)
Nice to meet you !  Trial wellbutrin.   Ct high resolution.   Bupropion extended-release tablets (Depression/Mood Disorders) What is this medicine? BUPROPION (byoo PROE pee on) is used to treat depression. This medicine may be used for other purposes; ask your health care provider or pharmacist if you have questions. COMMON BRAND NAME(S): Aplenzin, Budeprion XL, Forfivo XL, Wellbutrin XL What should I tell my health care provider before I take this medicine? They need to know if you have any of these conditions:  an eating disorder, such as anorexia or bulimia  bipolar disorder or psychosis  diabetes or high blood sugar, treated with medication  glaucoma  head injury or brain tumor  heart disease, previous heart attack, or irregular heart beat  high blood pressure  kidney or liver disease  seizures (convulsions)  suicidal thoughts or a previous suicide attempt  Tourette's syndrome  weight loss  an unusual or allergic reaction to bupropion, other medicines, foods, dyes, or preservatives  breast-feeding  pregnant or trying to become pregnant How should I use this medicine? Take this medicine by mouth with a glass of water. Follow the directions on the prescription label. You can take it with or without food. If it upsets your stomach, take it with food. Do not crush, chew, or cut these tablets. This medicine is taken once daily at the same time each day. Do not take your medicine more often than directed. Do not stop taking this medicine suddenly except upon the advice of your doctor. Stopping this medicine too quickly may cause serious side effects or your condition may worsen. A special MedGuide will be given to you by the pharmacist with each prescription and refill. Be sure to read this information carefully each time. Talk to your pediatrician regarding the use of this medicine in children. Special care may be needed. Overdosage: If you think you have taken too  much of this medicine contact a poison control center or emergency room at once. NOTE: This medicine is only for you. Do not share this medicine with others. What if I miss a dose? If you miss a dose, skip the missed dose and take your next tablet at the regular time. Do not take double or extra doses. What may interact with this medicine? Do not take this medicine with any of the following medications:  linezolid  MAOIs like Azilect, Carbex, Eldepryl, Marplan, Nardil, and Parnate  methylene blue (injected into a vein)  other medicines that contain bupropion like Zyban This medicine may also interact with the following medications:  alcohol  certain medicines for anxiety or sleep  certain medicines for blood pressure like metoprolol, propranolol  certain medicines for depression or psychotic disturbances  certain medicines for HIV or AIDS like efavirenz, lopinavir, nelfinavir, ritonavir  certain medicines for irregular heart beat like propafenone, flecainide  certain medicines for Parkinson's disease like amantadine, levodopa  certain medicines for seizures like carbamazepine, phenytoin, phenobarbital  cimetidine  clopidogrel  cyclophosphamide  digoxin  furazolidone  isoniazid  nicotine  orphenadrine  procarbazine  steroid medicines like prednisone or cortisone  stimulant medicines for attention disorders, weight loss, or to stay awake  tamoxifen  theophylline  thiotepa  ticlopidine  tramadol  warfarin This list may not describe all possible interactions. Give your health care provider a list of all the medicines, herbs, non-prescription drugs, or dietary supplements you use. Also tell them if you smoke, drink alcohol, or use illegal drugs. Some items may interact with your medicine.  What should I watch for while using this medicine? Tell your doctor if your symptoms do not get better or if they get worse. Visit your doctor or healthcare provider for  regular checks on your progress. Because it may take several weeks to see the full effects of this medicine, it is important to continue your treatment as prescribed by your doctor. This medicine may cause serious skin reactions. They can happen weeks to months after starting the medicine. Contact your healthcare provider right away if you notice fevers or flu-like symptoms with a rash. The rash may be red or purple and then turn into blisters or peeling of the skin. Or, you might notice a red rash with swelling of the face, lips or lymph nodes in your neck or under your arms. Patients and their families should watch out for new or worsening thoughts of suicide or depression. Also watch out for sudden changes in feelings such as feeling anxious, agitated, panicky, irritable, hostile, aggressive, impulsive, severely restless, overly excited and hyperactive, or not being able to sleep. If this happens, especially at the beginning of treatment or after a change in dose, call your healthcare provider. Avoid alcoholic drinks while taking this medicine. Drinking large amounts of alcoholic beverages, using sleeping or anxiety medicines, or quickly stopping the use of these agents while taking this medicine may increase your risk for a seizure. Do not drive or use heavy machinery until you know how this medicine affects you. This medicine can impair your ability to perform these tasks. Do not take this medicine close to bedtime. It may prevent you from sleeping. Your mouth may get dry. Chewing sugarless gum or sucking hard candy, and drinking plenty of water may help. Contact your doctor if the problem does not go away or is severe. The tablet shell for some brands of this medicine does not dissolve. This is normal. The tablet shell may appear whole in the stool. This is not a cause for concern. What side effects may I notice from receiving this medicine? Side effects that you should report to your doctor or health  care professional as soon as possible:  allergic reactions like skin rash, itching or hives, swelling of the face, lips, or tongue  breathing problems  changes in vision  confusion  elevated mood, decreased need for sleep, racing thoughts, impulsive behavior  fast or irregular heartbeat  hallucinations, loss of contact with reality  increased blood pressure  rash, fever, and swollen lymph nodes  redness, blistering, peeling or loosening of the skin, including inside the mouth  seizures  suicidal thoughts or other mood changes  unusually weak or tired  vomiting Side effects that usually do not require medical attention (report to your doctor or health care professional if they continue or are bothersome):  constipation  headache  loss of appetite  nausea  tremors  weight loss This list may not describe all possible side effects. Call your doctor for medical advice about side effects. You may report side effects to FDA at 1-800-FDA-1088. Where should I keep my medicine? Keep out of the reach of children. Store at room temperature between 15 and 30 degrees C (59 and 86 degrees F). Throw away any unused medicine after the expiration date. NOTE: This sheet is a summary. It may not cover all possible information. If you have questions about this medicine, talk to your doctor, pharmacist, or health care provider.  2020 Elsevier/Gold Standard (2019-01-09 13:45:31)

## 2019-12-11 NOTE — Progress Notes (Signed)
I spoke with patient & she would like to pursue ECHO.

## 2019-12-14 ENCOUNTER — Telehealth: Payer: Self-pay | Admitting: Family

## 2019-12-14 ENCOUNTER — Ambulatory Visit: Payer: 59

## 2019-12-14 DIAGNOSIS — U071 COVID-19: Secondary | ICD-10-CM

## 2019-12-14 NOTE — Telephone Encounter (Signed)
Gloria Dorsey, Call pt and let her know I ordered echo; please too let her know that I am following up on scheduling CT Chest   Rasheedah, what is status of CT chest?

## 2019-12-15 NOTE — Telephone Encounter (Signed)
LM per DPR advising on below. I asked that she call back if any further questions or she does not hear from our office about scheduling for either.

## 2019-12-16 ENCOUNTER — Other Ambulatory Visit: Payer: Self-pay | Admitting: Family

## 2019-12-16 DIAGNOSIS — R0602 Shortness of breath: Secondary | ICD-10-CM

## 2019-12-16 DIAGNOSIS — U071 COVID-19: Secondary | ICD-10-CM

## 2019-12-27 ENCOUNTER — Encounter: Payer: Self-pay | Admitting: Family

## 2019-12-29 ENCOUNTER — Other Ambulatory Visit: Payer: Self-pay

## 2019-12-29 ENCOUNTER — Other Ambulatory Visit: Payer: Self-pay | Admitting: Family

## 2019-12-29 ENCOUNTER — Ambulatory Visit: Admission: RE | Admit: 2019-12-29 | Payer: 59 | Source: Ambulatory Visit

## 2019-12-29 NOTE — Progress Notes (Signed)
D/ced wellbutrin, added as allergy

## 2020-01-05 ENCOUNTER — Telehealth: Payer: Self-pay | Admitting: Family

## 2020-01-05 ENCOUNTER — Other Ambulatory Visit: Payer: Self-pay

## 2020-01-05 ENCOUNTER — Ambulatory Visit
Admission: RE | Admit: 2020-01-05 | Discharge: 2020-01-05 | Disposition: A | Discharge status: Home / Self Care | Payer: 59 | Source: Ambulatory Visit | Attending: Family | Admitting: Family

## 2020-01-05 DIAGNOSIS — R0602 Shortness of breath: Secondary | ICD-10-CM | POA: Insufficient documentation

## 2020-01-05 DIAGNOSIS — U071 COVID-19: Secondary | ICD-10-CM | POA: Insufficient documentation

## 2020-01-05 NOTE — Telephone Encounter (Signed)
Marylene Land from [facility] Nurse called. Patinet needs to be seen in the next few hours. No appointments available at office.

## 2020-01-05 NOTE — Telephone Encounter (Signed)
SENT TO ACCESS NURSE Pt called she is having sever hip pain that radiates down her right leg, right lower back  Pain scale is a 10 when sitting and walking. Unable to put pressure on the right leg When laying down the pain is a 4

## 2020-01-05 NOTE — Telephone Encounter (Signed)
Spoken to patient. She agreed to go to [facility] due to not having appointments available here in clinic.

## 2020-01-06 ENCOUNTER — Encounter: Payer: Self-pay | Admitting: Family

## 2020-01-16 ENCOUNTER — Ambulatory Visit
Admission: RE | Admit: 2020-01-16 | Discharge: 2020-01-16 | Disposition: A | Discharge status: Home / Self Care | Payer: 59 | Source: Ambulatory Visit | Attending: Family | Admitting: Family

## 2020-01-16 ENCOUNTER — Other Ambulatory Visit: Payer: Self-pay

## 2020-01-16 DIAGNOSIS — R0602 Shortness of breath: Secondary | ICD-10-CM | POA: Insufficient documentation

## 2020-01-16 DIAGNOSIS — Z8616 Personal history of COVID-19: Secondary | ICD-10-CM | POA: Insufficient documentation

## 2020-01-16 DIAGNOSIS — U071 COVID-19: Secondary | ICD-10-CM

## 2020-01-16 NOTE — Progress Notes (Signed)
*  PRELIMINARY RESULTS* Echocardiogram 2D Echocardiogram has been performed.  Gloria Dorsey 01/16/2020, 10:47 AM

## 2020-01-20 ENCOUNTER — Encounter: Payer: Self-pay | Admitting: Family

## 2020-01-21 ENCOUNTER — Other Ambulatory Visit: Payer: Self-pay | Admitting: Family

## 2020-01-21 DIAGNOSIS — U071 COVID-19: Secondary | ICD-10-CM

## 2020-02-09 ENCOUNTER — Ambulatory Visit: Payer: 59 | Admitting: Cardiovascular Disease

## 2020-02-10 ENCOUNTER — Telehealth: Payer: Self-pay | Admitting: Family

## 2020-02-10 NOTE — Telephone Encounter (Signed)
Rejection Reason - Reschedule - Patient cancelled 02/09/20 visit---did not reschedule" [facility] said about 15 hours ago

## 2020-02-17 NOTE — Telephone Encounter (Signed)
Call pt We discussed cardiology consult in setting of shortness of breath after COVID-19 AND abnormal echocardiogram.  Appears she declined referral, please advise to reconsider

## 2020-02-17 NOTE — Telephone Encounter (Signed)
Patient stated that she would reconsider, but was not sure if she needed to be referred back to cardiology or just call? It looks like referral was requested by patient to be cancelled. Please advise if patient needs to wait for new referral or call [facility]?

## 2020-02-19 NOTE — Telephone Encounter (Signed)
Pt states that she called cardiology and they said the referral was closed and they require another one to be sent in by PCP. Please return her call.

## 2020-02-19 NOTE — Telephone Encounter (Signed)
I spoke with pt about calling to schedule appt. Pt was given the number to call.

## 2020-02-19 NOTE — Telephone Encounter (Signed)
Can we place new referral?

## 2020-02-19 NOTE — Telephone Encounter (Signed)
Ok. I opened it on our end. Thank you! I'll wait to hear from Claris Che or Maralyn Sago.

## 2020-02-25 ENCOUNTER — Other Ambulatory Visit: Payer: Self-pay | Admitting: Family

## 2020-02-25 DIAGNOSIS — U071 COVID-19: Secondary | ICD-10-CM

## 2020-02-25 NOTE — Telephone Encounter (Signed)
New referral to cardiology in place.   Let me know if you need anything else

## 2020-02-26 NOTE — Telephone Encounter (Signed)
Thanks

## 2020-03-01 ENCOUNTER — Encounter: Payer: Self-pay | Admitting: Cardiology

## 2020-03-01 ENCOUNTER — Ambulatory Visit (INDEPENDENT_AMBULATORY_CARE_PROVIDER_SITE_OTHER): Payer: 59 | Admitting: Cardiology

## 2020-03-01 ENCOUNTER — Other Ambulatory Visit: Payer: Self-pay

## 2020-03-01 VITALS — BP 130/80 | HR 76 | Temp 97.7°F | Ht 60.75 in | Wt 213.2 lb

## 2020-03-01 DIAGNOSIS — Z6841 Body Mass Index (BMI) 40.0 and over, adult: Secondary | ICD-10-CM | POA: Diagnosis not present

## 2020-03-01 DIAGNOSIS — R0602 Shortness of breath: Secondary | ICD-10-CM | POA: Diagnosis not present

## 2020-03-01 NOTE — Patient Instructions (Signed)
Medication Instructions:  Your physician recommends that you continue on your current medications as directed. Please refer to the Current Medication list given to you today.  *If you need a refill on your cardiac medications before your next appointment, please call your pharmacy*   Lab Work: None ordered If you have labs (blood work) drawn today and your tests are completely normal, you will receive your results only by: . MyChart Message (if you have MyChart) OR . A paper copy in the mail If you have any lab test that is abnormal or we need to change your treatment, we will call you to review the results.   Testing/Procedures: None ordered   Follow-Up: At [facility], you and your health needs are our priority.  As part of our continuing mission to provide you with exceptional heart care, we have created designated Provider Care Teams.  These Care Teams include your primary Cardiologist (physician) and Advanced Practice Providers (APPs -  Physician Assistants and Nurse Practitioners) who all work together to provide you with the care you need, when you need it.  We recommend signing up for the patient portal called "MyChart".  Sign up information is provided on this After Visit Summary.  MyChart is used to connect with patients for Virtual Visits (Telemedicine).  Patients are able to view lab/test results, encounter notes, upcoming appointments, etc.  Non-urgent messages can be sent to your provider as well.   To learn more about what you can do with MyChart, go to [email or web address].    Your next appointment:   As needed   The format for your next appointment:   In Person  Provider:    You may see Brian Agbor-Etang, MD   or one of the following Advanced Practice Providers on your designated Care Team:    Christopher Berge, NP  Ryan Dunn, PA-C  Jacquelyn Visser, PA-C    Other Instructions N/A  

## 2020-03-01 NOTE — Progress Notes (Signed)
Cardiology Office Note:    Date:  03/01/2020   ID:  Gloria Dorsey, DOB April 01, 1976, MRN [id number]  PCP:  Burnard Hawthorne, FNP  Cardiologist:  Kate Sable, MD  Electrophysiologist:  None   Referring MD: Burnard Hawthorne, FNP   Chief Complaint  Patient presents with  . New Patient (Initial Visit)    Referred by Dr. Mable Paris, FNP, for SOB possibly related to COVID 19-diagnosed with COVID in August 2020. Patient reports having a recent abnormal echocardiogram; Meds verbally reviewed with patient.   Gloria Dorsey is a 44 y.o. female who is being seen today for the evaluation of shortness of breath at the request of Vidal Schwalbe, Yvetta Coder, FNP.   History of Present Illness:    Gloria Dorsey is a 44 y.o. female with a hx of GERD, recent COVID-9 infection in July 2020 who presents due to shortness of breath.  Patient states having shortness of breath starting around June 2020.  She was diagnosed with COVID-19 and had lingering symptoms of shortness of breath into December.  Over the past couple of months she feels like her symptoms have improved drastically.  She lost her mother last year and also has some issues and gained roughly 40 pounds.  She denies chest pain, edema, orthopnea.  Due to shortness of breath, echocardiogram obtained on 01/16/2020 showed normal systolic and diastolic function, EF 55 to 60%.  Past Medical History:  Diagnosis Date  . Chicken pox   . GERD (gastroesophageal reflux disease)   . Hay fever   . Kyphoscoliosis   . Raynaud's disease     Past Surgical History:  Procedure Laterality Date  . NASAL SEPTUM SURGERY  2009    Current Medications: Current Meds  Medication Sig  . albuterol (VENTOLIN HFA) 108 (90 Base) MCG/ACT inhaler Inhale 2 puffs into the lungs every 6 (six) hours as needed for wheezing or shortness of breath.  Marland Kitchen ascorbic acid (VITAMIN C) 250 MG CHEW Chew 250 mg by mouth daily. Takes occasionally  . calcium carbonate (CALCIUM 600) 600  MG TABS tablet Take 600 mg by mouth 2 (two) times daily with a meal. Takes occassionally  . Spacer/Aero-Holding Chambers (AEROCHAMBER PLUS FLO-VU LARGE) MISC Use spacer with albuterol inhaler  . Vitamin D, Cholecalciferol, 50 MCG (2000 UT) CAPS Take 1 tablet by mouth daily. Takes occassionally  . Zinc 50 MG TABS Take 1 tablet by mouth daily. Takes occassionally     Allergies:   Wellbutrin [bupropion] and Tramadol   Social History   Socioeconomic History  . Marital status: Married    Spouse name: Not on file  . Number of children: Not on file  . Years of education: Not on file  . Highest education level: Not on file  Occupational History  . Not on file  Tobacco Use  . Smoking status: Former Research scientist (life sciences)  . Smokeless tobacco: Never Used  Substance and Sexual Activity  . Alcohol use: Never  . Drug use: Never  . Sexual activity: Yes    Birth control/protection: I.U.D.  Other Topics Concern  . 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 Difficulty of Paying Living Expenses:   Food Insecurity:   . Worried About Charity fundraiser in the Last Year:   . [place] in the Last Year:  Transportation Needs:   . Freight forwarder (Medical):   Marland Kitchen Lack of Transportation (Non-Medical):   Physical Activity:   . Days of Exercise per Week:   . Minutes of Exercise per Session:   Stress:   . Feeling of Stress :   Social Connections:   . Frequency of Communication with Friends and Family:   . Frequency of Social Gatherings with Friends and Family:   . Attends Religious Services:   . Active Member of Clubs or Organizations:   . Attends Banker Meetings:   Marland Kitchen Marital Status:      Family History: The patient's family history includes Alcohol abuse in her brother; Arthritis in her father, maternal grandmother,  and mother; Asthma in her mother; COPD in her brother; Cancer in her brother, maternal grandmother, and paternal grandfather; Depression in her maternal grandmother; Diabetes in her maternal grandmother and mother; Heart disease in her father, maternal grandmother, mother, and paternal grandmother; Hyperlipidemia in her father and maternal grandmother; Hypertension in her maternal grandmother and mother; Kidney disease in her maternal grandmother; Stroke in her father and maternal grandfather.  ROS:   Please see the history of present illness.     All other systems reviewed and are negative.  EKGs/Labs/Other Studies Reviewed:    The following studies were reviewed today:   EKG:  EKG is  ordered today.  The ekg ordered today demonstrates normal sinus rhythm, sinus arrhythmia, nonspecific T wave changes.  Recent Labs: 06/04/2019: BUN 9; Creatinine, Ser 0.61; Hemoglobin 14.6; Magnesium 1.9; Platelets 185; Potassium 2.8; Sodium 137  Recent Lipid Panel    Component Value Date/Time   CHOL 221 (H) 10/29/2018 1051   TRIG 204 (H) 10/29/2018 1051   HDL 60 10/29/2018 1051   CHOLHDL 3.7 10/29/2018 1051   LDLCALC 127 (H) 10/29/2018 1051    Physical Exam:    VS:  BP 130/80 (BP Location: Right Arm, Patient Position: Sitting, Cuff Size: Normal)   Pulse 76   Temp 97.7 F (36.5 C)   Ht 5' 0.75" (1.543 m)   Wt 213 lb 4 oz (96.7 kg)   SpO2 98%   BMI 40.63 kg/m     Wt Readings from Last 3 Encounters:  03/01/20 213 lb 4 oz (96.7 kg)  12/05/19 193 lb (87.5 kg)  06/04/19 180 lb (81.6 kg)     GEN:  Well nourished, well developed in no acute distress HEENT: Normal NECK: No JVD; No carotid bruits LYMPHATICS: No lymphadenopathy CARDIAC: RRR, no murmurs, rubs, gallops RESPIRATORY:  Clear to auscultation without rales, wheezing or rhonchi  ABDOMEN: Soft, non-tender, non-distended MUSCULOSKELETAL:  No edema; No deformity  SKIN: Warm and dry NEUROLOGIC:  Alert and oriented x 3 PSYCHIATRIC:  Normal  affect   ASSESSMENT:    1. SOB (shortness of breath)   2. BMI 40.0-44.9, adult (HCC)    PLAN:    In order of problems listed above:  1. Patient with sudden shortness of breath post COVID-19 infection.  Her symptoms have gradually improved.  Her symptoms are likely due to post viral sequelae which again are improving.  Echocardiogram obtained showed normal systolic and diastolic function with no valvular abnormalities.  No indication at this time for additional cardiac testing.  She did well shortness of breath likely secondary to obesity/deconditioning 2. Patient is morbidly obese, low-calorie diet, weight loss advised.  Follow-up as needed  This note was generated in part or whole with voice recognition software. Voice recognition is usually quite accurate but  there are transcription errors that can and very often do occur. I apologize for any typographical errors that were not detected and corrected.  Medication Adjustments/Labs and Tests Ordered: Current medicines are reviewed at length with the patient today.  Concerns regarding medicines are outlined above.  Orders Placed This Encounter  Procedures  . EKG 12-Lead   No orders of the defined types were placed in this encounter.   Patient Instructions  Medication Instructions:  Your physician recommends that you continue on your current medications as directed. Please refer to the Current Medication list given to you today.  *If you need a refill on your cardiac medications before your next appointment, please call your pharmacy*   Lab Work: None ordered If you have labs (blood work) drawn today and your tests are completely normal, you will receive your results only by: Marland Kitchen MyChart Message (if you have MyChart) OR . A paper copy in the mail If you have any lab test that is abnormal or we need to change your treatment, we will call you to review the results.   Testing/Procedures: None ordered   Follow-Up: At [facility], you and your health needs are our priority.  As part of our continuing mission to provide you with exceptional heart care, we have created designated Provider Care Teams.  These Care Teams include your primary Cardiologist (physician) and Advanced Practice Providers (APPs -  Physician Assistants and Nurse Practitioners) who all work together to provide you with the care you need, when you need it.  We recommend signing up for the patient portal called "MyChart".  Sign up information is provided on this After Visit Summary.  MyChart is used to connect with patients for Virtual Visits (Telemedicine).  Patients are able to view lab/test results, encounter notes, upcoming appointments, etc.  Non-urgent messages can be sent to your provider as well.   To learn more about what you can do with MyChart, go to [email or web address].    Your next appointment:   As needed   The format for your next appointment:   In Person  Provider:    You may see Debbe Odea, MD or one of the following Advanced Practice Providers on your designated Care Team:    Nicolasa Ducking, NP  Eula Listen, PA-C  Marisue Ivan, PA-C    Other Instructions N/A     Signed, Debbe Odea, MD  03/01/2020 5:03 PM    [facility]

## 2020-03-03 ENCOUNTER — Ambulatory Visit: Payer: 59 | Admitting: Family

## 2020-03-05 ENCOUNTER — Ambulatory Visit: Payer: 59 | Admitting: Family

## 2020-03-23 ENCOUNTER — Ambulatory Visit: Payer: 59 | Attending: Internal Medicine

## 2020-03-23 DIAGNOSIS — Z23 Encounter for immunization: Secondary | ICD-10-CM

## 2020-03-23 NOTE — Progress Notes (Signed)
   Covid-19 Vaccination Clinic  Name:  Victoriana Aziz    MRN: [id number] DOB: 08-04-1976  03/23/2020  Ms. Mcneff was observed post Covid-19 immunization for 15 minutes without incident. She was provided with Vaccine Information Sheet and instruction to access the V-Safe system.   Ms. Dimaano was instructed to call 911 with any severe reactions post vaccine: Marland Kitchen Difficulty breathing  . Swelling of face and throat  . A fast heartbeat  . A bad rash all over body  . Dizziness and weakness   Immunizations Administered    Name Date Dose VIS Date Route   Pfizer COVID-19 Vaccine 03/23/2020  3:23 PM 0.3 mL 12/24/2018 Intramuscular   Manufacturer: [employer], [employer]   Lot: [id number]   NDC: [address]-4

## 2020-04-13 ENCOUNTER — Ambulatory Visit: Payer: 59 | Attending: Internal Medicine

## 2020-04-13 DIAGNOSIS — Z23 Encounter for immunization: Secondary | ICD-10-CM

## 2020-04-13 NOTE — Progress Notes (Signed)
   Covid-19 Vaccination Clinic  Name:  Gloria Dorsey    MRN: [id number] DOB: [birth year]  04/13/2020  [name] was observed post Covid-19 immunization for 15 minutes without incident. She was provided with Vaccine Information Sheet and instruction to access the V-Safe system.   [name] was instructed to call 911 with any severe reactions post vaccine: Marland Kitchen Difficulty breathing  . Swelling of face and throat  . A fast heartbeat  . A bad rash all over body  . Dizziness and weakness   Immunizations Administered    Name Date Dose VIS Date Route   Pfizer COVID-19 Vaccine 04/13/2020  4:00 PM 0.3 mL 12/24/2018 Intramuscular   Manufacturer: [employer], [employer]   Lot: [id number]   NDC: [address]-4

## 2022-01-27 IMAGING — CT CT CHEST W/O CM
2 of 4 series · 15 of 36 positions shown, 18 images · non-contrast
Comparison: No comparison studies available.

CLINICAL DATA: Shortness of breath. Remote history of COVID.

EXAM:
CT CHEST WITHOUT CONTRAST
TECHNIQUE: Multidetector CT imaging of the chest was performed following the
standard protocol without IV contrast.

[Series 2: chest 2.00 · axial · 0.71mm/px · z∈[-1203,-941]mm · 12 of 155 slices shown, 15 images]
[im 12/155  mediastinal]
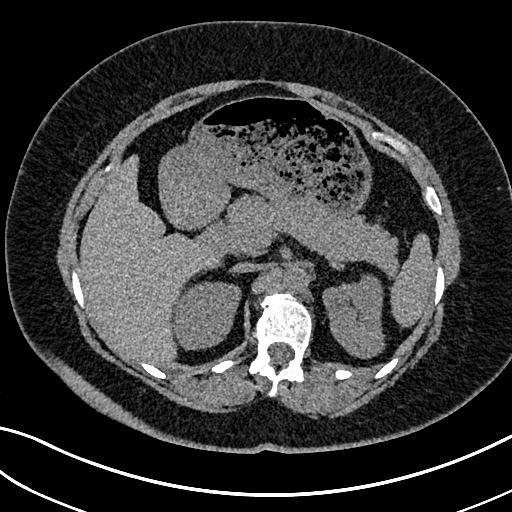
[im 12/155  lung]
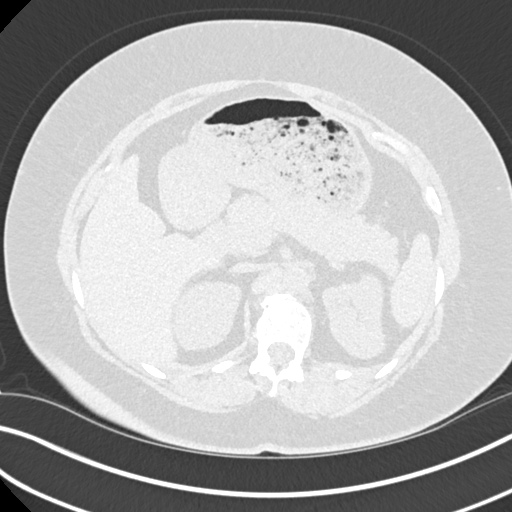
[im 24/155  lung]
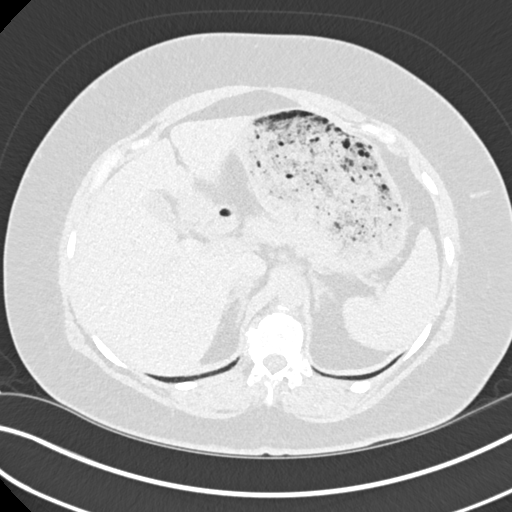
[im 36/155  lung]
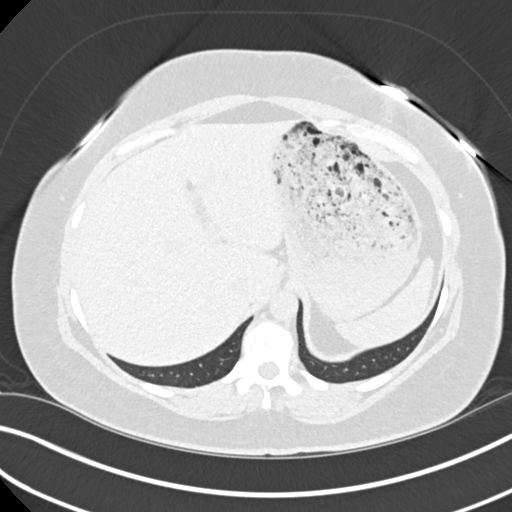
[im 48/155  lung]
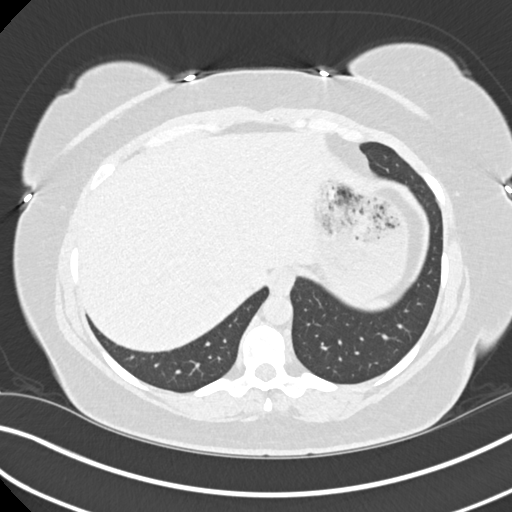
[im 60/155  mediastinal]
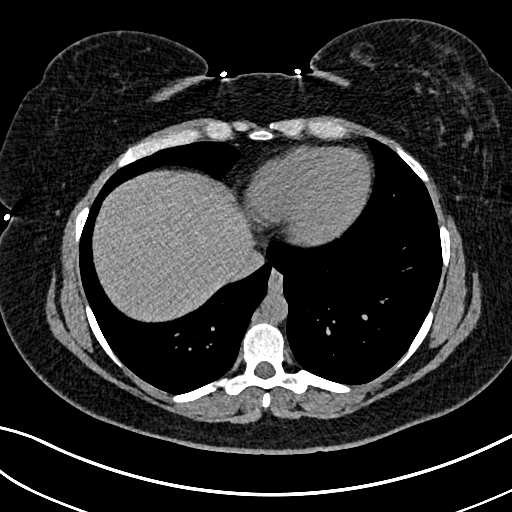
[im 60/155  lung]
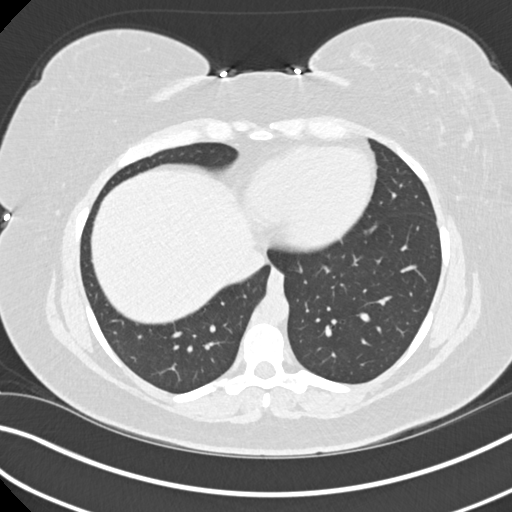
[im 72/155  lung]
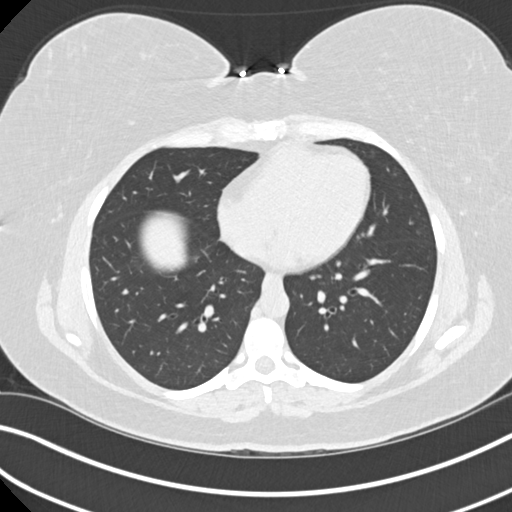
[im 83/155  lung]
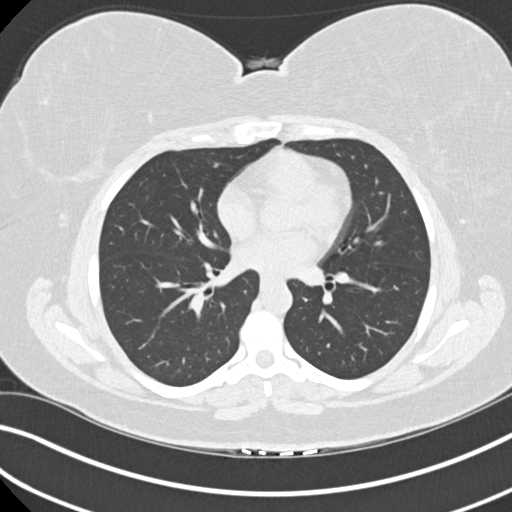
[im 95/155  lung]
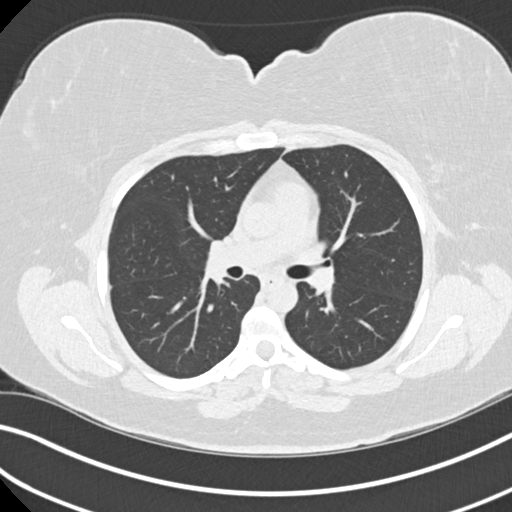
[im 107/155  mediastinal]
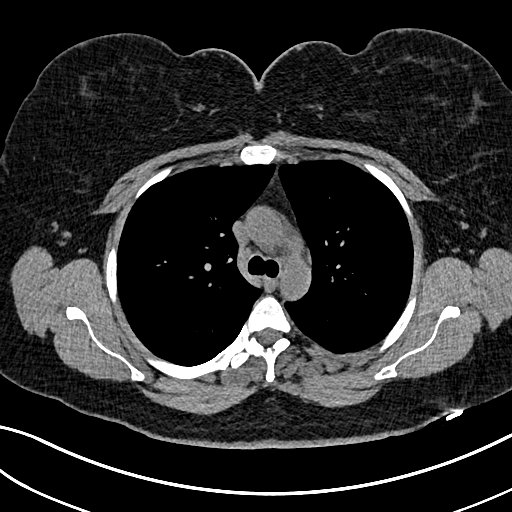
[im 107/155  lung]
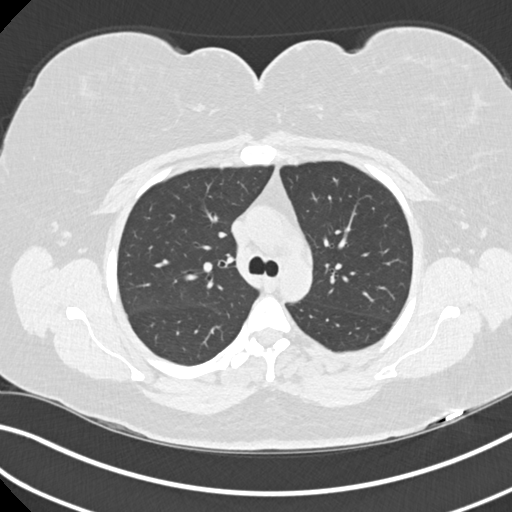
[im 119/155  lung]
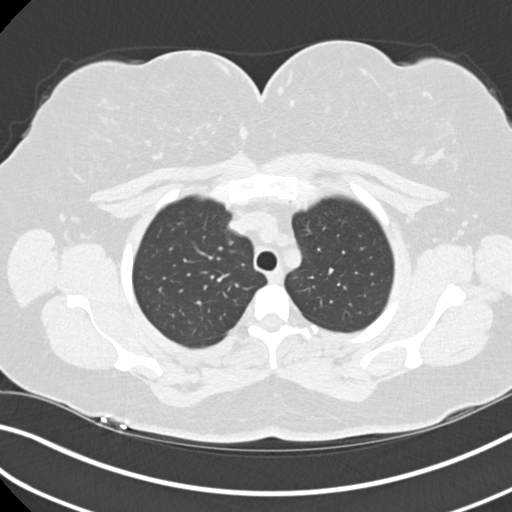
[im 131/155  lung]
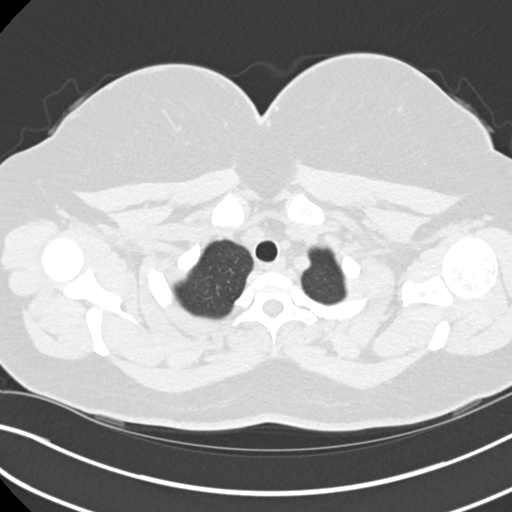
[im 143/155  lung]
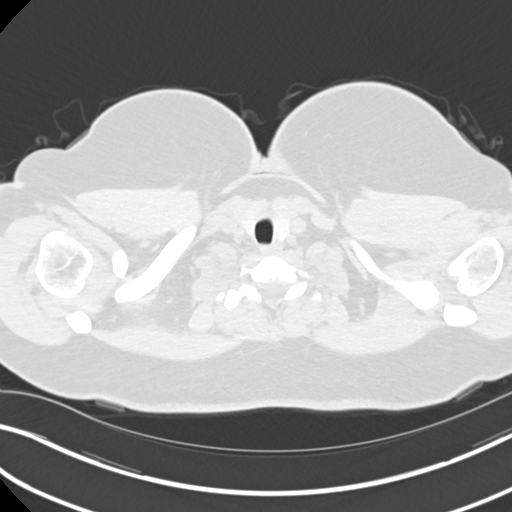

[Series 5: coronals chest 2.00 cor · coronal · 0.61mm/px · 3 of 150 slices shown]
[im 30/150  lung]
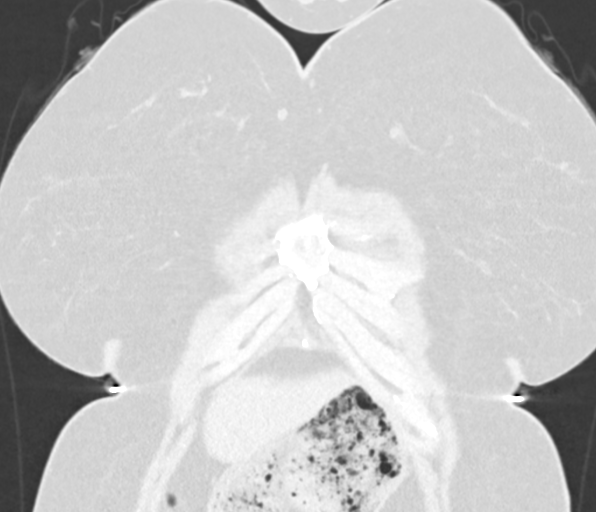
[im 60/150  lung]
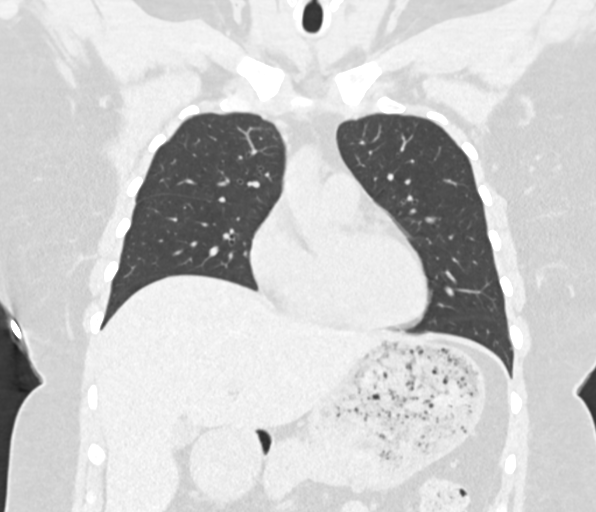
[im 90/150  lung]
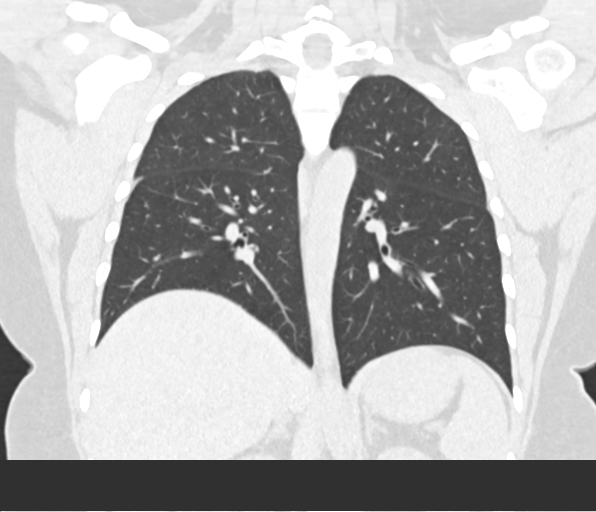

[15 of 36 positions shown; findings below may reference images not displayed]

FINDINGS: Cardiovascular: The heart size is normal. No substantial pericardial
effusion. No thoracic aortic aneurysm.

Mediastinum/Nodes: No mediastinal lymphadenopathy. No evidence for
gross hilar lymphadenopathy although assessment is limited by the
lack of intravenous contrast on today's study. The esophagus has
normal imaging features. There is no axillary lymphadenopathy.

Lungs/Pleura: No suspicious pulmonary nodule or mass. No focal
airspace consolidation. No pleural effusion.

Upper Abdomen: 2 mm nonobstructing stone identified upper pole left
kidney. Gallbladder decompressed.

Musculoskeletal: No worrisome lytic or sclerotic osseous
abnormality.
IMPRESSION: 1. Unremarkable CT chest. Specifically, no findings to explain the
patient's history of shortness of breath.
2. 2 mm nonobstructing stone upper pole left kidney.

## 2023-11-02 ENCOUNTER — Other Ambulatory Visit: Payer: 59

## 2024-03-19 ENCOUNTER — Other Ambulatory Visit: Payer: Self-pay | Admitting: Family Medicine

## 2024-03-19 DIAGNOSIS — Z1231 Encounter for screening mammogram for malignant neoplasm of breast: Secondary | ICD-10-CM

## 2024-04-23 ENCOUNTER — Other Ambulatory Visit: Payer: Self-pay | Admitting: Family Medicine

## 2024-04-23 ENCOUNTER — Ambulatory Visit
Admission: RE | Admit: 2024-04-23 | Discharge: 2024-04-23 | Disposition: A | Discharge status: Home / Self Care | Source: Ambulatory Visit | Attending: Family Medicine | Admitting: Family Medicine

## 2024-04-23 DIAGNOSIS — M419 Scoliosis, unspecified: Secondary | ICD-10-CM

## 2024-04-23 DIAGNOSIS — M5416 Radiculopathy, lumbar region: Secondary | ICD-10-CM

## 2024-05-20 ENCOUNTER — Other Ambulatory Visit: Payer: Self-pay

## 2024-05-20 ENCOUNTER — Encounter: Payer: Self-pay | Admitting: Emergency Medicine

## 2024-05-20 ENCOUNTER — Emergency Department
Admission: EM | Admit: 2024-05-20 | Discharge: 2024-05-20 | Disposition: A | Discharge status: Home / Self Care | Attending: Emergency Medicine | Admitting: Emergency Medicine

## 2024-05-20 ENCOUNTER — Emergency Department

## 2024-05-20 DIAGNOSIS — M545 Low back pain, unspecified: Secondary | ICD-10-CM | POA: Diagnosis present

## 2024-05-20 DIAGNOSIS — G8929 Other chronic pain: Secondary | ICD-10-CM | POA: Insufficient documentation

## 2024-05-20 DIAGNOSIS — M5441 Lumbago with sciatica, right side: Secondary | ICD-10-CM | POA: Diagnosis not present

## 2024-05-20 DIAGNOSIS — M5442 Lumbago with sciatica, left side: Secondary | ICD-10-CM | POA: Diagnosis not present

## 2024-05-20 DIAGNOSIS — M5416 Radiculopathy, lumbar region: Secondary | ICD-10-CM | POA: Diagnosis not present

## 2024-05-20 MED ORDER — METHOCARBAMOL 1000 MG/10ML IJ SOLN
500.0000 mg | Freq: Once | INTRAMUSCULAR | Status: DC
  Filled 2024-05-20: qty 5

## 2024-05-20 MED ORDER — ONDANSETRON HCL 4 MG/2ML IJ SOLN
4.0000 mg | Freq: Once | INTRAMUSCULAR | Status: AC
  Administered 2024-05-20: 4 mg via INTRAVENOUS
  Filled 2024-05-20: qty 2

## 2024-05-20 MED ORDER — TIZANIDINE HCL 2 MG PO TABS: 2.0000 mg | ORAL_TABLET | Freq: Three times a day (TID) | ORAL | 0 refills | Status: AC

## 2024-05-20 MED ORDER — TIZANIDINE HCL 4 MG PO TABS
4.0000 mg | ORAL_TABLET | Freq: Once | ORAL | Status: AC
  Administered 2024-05-20: 4 mg via ORAL
  Filled 2024-05-20: qty 1

## 2024-05-20 MED ORDER — DEXAMETHASONE SODIUM PHOSPHATE 10 MG/ML IJ SOLN
10.0000 mg | Freq: Once | INTRAMUSCULAR | Status: AC
  Administered 2024-05-20: 10 mg via INTRAVENOUS
  Filled 2024-05-20: qty 1

## 2024-05-20 MED ORDER — LORAZEPAM 1 MG PO TABS
1.0000 mg | ORAL_TABLET | Freq: Once | ORAL | Status: AC
  Administered 2024-05-20: 1 mg via ORAL
  Filled 2024-05-20: qty 1

## 2024-05-20 MED ORDER — GABAPENTIN 300 MG PO CAPS: 300.0000 mg | ORAL_CAPSULE | Freq: Three times a day (TID) | ORAL | 0 refills | Status: AC

## 2024-05-20 MED ORDER — HYDROMORPHONE HCL 1 MG/ML IJ SOLN
1.0000 mg | Freq: Once | INTRAMUSCULAR | Status: AC
  Administered 2024-05-20: 1 mg via INTRAVENOUS
  Filled 2024-05-20: qty 1

## 2024-05-20 MED ORDER — MORPHINE SULFATE (PF) 4 MG/ML IV SOLN
4.0000 mg | Freq: Once | INTRAVENOUS | Status: AC
  Administered 2024-05-20: 4 mg via INTRAVENOUS
  Filled 2024-05-20: qty 1

## 2024-05-20 MED ORDER — PREDNISONE 20 MG PO TABS: 40.0000 mg | ORAL_TABLET | Freq: Every day | ORAL | 0 refills | Status: AC

## 2024-05-20 MED ORDER — OXYCODONE-ACETAMINOPHEN 5-325 MG PO TABS: 1.0000 | ORAL_TABLET | Freq: Three times a day (TID) | ORAL | 0 refills | Status: AC | PRN

## 2024-05-20 NOTE — Discharge Instructions (Addendum)
 Your exam and MRI confirm lumbar radiculopathy and what appears to be a new herniated disc at L3-4.  Pick up prescription meds as directed.  Follow-up with your primary provider and specialist as discussed.  Return to the ED if needed.

## 2024-05-20 NOTE — ED Provider Notes (Signed)
 [facility] Emergency Department Provider Note     Event Date/Time   First MD Initiated Contact with Patient 05/20/24 [phone]     (approximate)   History   Back Pain   HPI  Gloria Dorsey is a 48 y.o. female with a history of obesity, vitamin D  deficiency, Raynaud's disease, GERD, and kyphoscoliosis, presents to the ED endorsing low back pain with referral into the bilateral lower extremities.  She denies any preceding injury, trauma, or fall.  Patient notes onset of this acute flare at about 3 AM while she rolled over in bed.  Patient with history including annual evaluation with a spine specialist in [place], reports that she is accustomed to dealing with back pain.  Her symptoms in this respect, are different and that she has bilateral symptoms from that buttocks to the ankles.  She has medications at home to take for flares and exacerbations which she doses judiciously.  She denies any bladder or bowel incontinence, foot drop, or saddle anesthesia.  Patient is able to pull up her MRI report from 2023, which speaks to multilevel DDD, left-sided foraminal stenosis, and congenital sacralization of the L5 vertebrae.  Physical Exam   Triage Vital Signs: ED Triage Vitals [05/20/24 0935]  Encounter Vitals Group     BP (!) 150/107     Girls Systolic BP Percentile      Girls Diastolic BP Percentile      Boys Systolic BP Percentile      Boys Diastolic BP Percentile      Pulse Rate 89     Resp 18     Temp 98.3 F (36.8 C)     Temp Source Oral     SpO2 100 %     Weight 189 lb (85.7 kg)     Height 5' 1 (1.549 m)     Head Circumference      Peak Flow      Pain Score 9     Pain Loc      Pain Education      Exclude from Growth Chart     Most recent vital signs: Vitals:   05/20/24 0935 05/20/24 1400  BP: (!) 150/107 (!) 144/98  Pulse: 89 87  Resp: 18 17  Temp: 98.3 F (36.8 C) 98.1 F (36.7 C)  SpO2: 100% 100%    General Awake, no distress. NAD.   Uncomfortable with sitting upright HEENT NCAT. PERRL. EOMI. No rhinorrhea. Mucous membranes are moist.  CV:  Good peripheral perfusion.  RESP:  Normal effort.  MSK:  Significant thoracolumbar scoliosis noted.  AROM of all extremities. NEURO: Cranial nerves II to XII grossly intact.  Normal LE DTRs bilaterally.  Normal toe dorsiflexion foot eversion on exam.   ED Results / Procedures / Treatments   Labs (all labs ordered are listed, but only abnormal results are displayed) Labs Reviewed - No data to display   EKG   RADIOLOGY  I personally viewed and evaluated these images as part of my medical decision making, as well as reviewing the written report by the radiologist.  ED Provider Interpretation: Multifactorial spinal stenosis, DDD, and L4-5 foraminal disc extrusion with left L4 nerve root impingement  IMPRESSION: 1. Transitional lumbosacral anatomy with 12 rib-bearing thoracic type vertebral bodies and a transitional, largely lumbarized S1 segment. 2. Moderate convex left scoliosis centered at L2. No acute osseous findings or malalignment. 3. Multilevel spondylosis superimposed on a congenitally small spinal canal. 4. Moderate right foraminal narrowing at  L2-3 with possible impingement on the right L2 nerve root within and beyond the foramen. 5. Mild multifactorial spinal stenosis at L3-4 with asymmetric narrowing of the left lateral recess and possible left L4 nerve root encroachment in the canal. 6. Severe left foraminal narrowing at L4-5 secondary to a left foraminal disc extrusion with probable resulting left L4 nerve root impingement.     Electronically Signed   By: Elsie Perone M.D.   On: 05/20/2024 12:53   PROCEDURES:  Critical Care performed: No  Procedures   MEDICATIONS ORDERED IN ED: Medications  ondansetron  (ZOFRAN ) injection 4 mg (4 mg Intravenous Given 05/20/24 1120)  morphine  (PF) 4 MG/ML injection 4 mg (4 mg Intravenous Given 05/20/24 1123)   LORazepam  (ATIVAN ) tablet 1 mg (1 mg Oral Given 05/20/24 1126)  dexamethasone  (DECADRON ) injection 10 mg (10 mg Intravenous Given 05/20/24 1409)  tiZANidine  (ZANAFLEX ) tablet 4 mg (4 mg Oral Given 05/20/24 1421)  HYDROmorphone  (DILAUDID ) injection 1 mg (1 mg Intravenous Given 05/20/24 1616)     IMPRESSION / MDM / ASSESSMENT AND PLAN / ED COURSE  I reviewed the triage vital signs and the nursing notes.                              Differential diagnosis includes, but is not limited to, DDD, radiculopathy, scoliosis, sacroiliitis  Patient's presentation is most consistent with acute complicated illness / injury requiring diagnostic workup.  Patient's diagnosis is consistent with lumbar radiculopathy secondary to multifactorial spinal stenosis and some L4 nerve root impingement on the left.  Patient with a history of chronic persistent low back pain due to severe thoracolumbar scoliosis, presents with acute exacerbation of her symptoms.  Patient without red flags on exam presenting with acute pain and disability.  Exam is overall reassuring at this time.  MRI images interpreted by me, reveals evidence of multifactorial spinal canal stenosis with what appears to be new discogenic pain at L4 on the left.  Patient with significant pain and disability on exam, requiring multiple doses of IV pain medicine.  We discussed the option of admission for ongoing pain management during the course in the ED.  Patient was inclined to discharge home to follow-up with her spine specialist and PCP of record.  Patient was able to finally get comfortable after final dose of IV Dilaudid .  She ambulated without difficulty and was discharged to the care of her husband.  Patient will be discharged home with prescriptions for gabapentin , Percocet, prednisone , and tizanidine . Patient is to follow up with her PCP or spine specialist as discussed, as needed or otherwise directed. Patient is given ED precautions to return to the ED  for any worsening or new symptoms.     FINAL CLINICAL IMPRESSION(S) / ED DIAGNOSES   Final diagnoses:  Chronic left-sided low back pain with bilateral sciatica  Lumbar radiculopathy     Rx / DC Orders   ED Discharge Orders          Ordered    tiZANidine  (ZANAFLEX ) 2 MG tablet  3 times daily        05/20/24 1611    predniSONE  (DELTASONE ) 20 MG tablet  Daily with breakfast        05/20/24 1611    gabapentin  (NEURONTIN ) 300 MG capsule  3 times daily        05/20/24 1611    oxyCODONE -acetaminophen  (PERCOCET) 5-325 MG tablet  Every 8 hours PRN  05/20/24 1612             Note:  This document was prepared using Dragon voice recognition software and may include unintentional dictation errors.    Loyd Candida LULLA Aldona, PA-C 05/21/24 2349    Levander Slate, MD 05/21/24 [phone]

## 2024-05-20 NOTE — ED Triage Notes (Signed)
 Patient to ED via POV for lower back pain that radiates into both legs. States that she's not sure if she twisted it while sleeping. Pain started around 3am. Denies loss of bowels or bladder. Hx scoliosis

## 2024-05-27 ENCOUNTER — Other Ambulatory Visit: Payer: Self-pay | Admitting: Family Medicine

## 2024-05-27 DIAGNOSIS — M5416 Radiculopathy, lumbar region: Secondary | ICD-10-CM

## 2024-06-10 ENCOUNTER — Ambulatory Visit
Admission: RE | Admit: 2024-06-10 | Discharge: 2024-06-10 | Disposition: A | Discharge status: Home / Self Care | Source: Ambulatory Visit | Attending: Family Medicine | Admitting: Family Medicine

## 2024-06-10 DIAGNOSIS — M5416 Radiculopathy, lumbar region: Secondary | ICD-10-CM

## 2024-06-10 MED ORDER — IOPAMIDOL (ISOVUE-200) INJECTION 41%
10.0000 mL | Freq: Once | INTRAVENOUS | Status: AC | PRN
  Administered 2024-06-10 (×2): 3 mL via INTRAVENOUS

## 2024-06-10 MED ORDER — METHYLPREDNISOLONE ACETATE 40 MG/ML INJ SUSP (RADIOLOG
80.0000 mg | Freq: Once | INTRAMUSCULAR | Status: AC
  Administered 2024-06-10 (×2): 120 mg via EPIDURAL

## 2024-07-03 ENCOUNTER — Other Ambulatory Visit: Payer: Self-pay | Admitting: Family Medicine

## 2024-07-03 DIAGNOSIS — M5416 Radiculopathy, lumbar region: Secondary | ICD-10-CM

## 2024-07-03 DIAGNOSIS — Q675 Congenital deformity of spine: Secondary | ICD-10-CM

## 2024-07-04 ENCOUNTER — Ambulatory Visit: Payer: Self-pay

## 2024-07-04 DIAGNOSIS — Z1211 Encounter for screening for malignant neoplasm of colon: Secondary | ICD-10-CM | POA: Diagnosis present

## 2024-07-04 DIAGNOSIS — K621 Rectal polyp: Secondary | ICD-10-CM | POA: Diagnosis not present

## 2024-07-29 ENCOUNTER — Ambulatory Visit
Admission: RE | Admit: 2024-07-29 | Discharge: 2024-07-29 | Disposition: A | Discharge status: Home / Self Care | Source: Ambulatory Visit | Attending: Family Medicine | Admitting: Family Medicine

## 2024-07-29 DIAGNOSIS — Q675 Congenital deformity of spine: Secondary | ICD-10-CM

## 2024-07-29 DIAGNOSIS — M5416 Radiculopathy, lumbar region: Secondary | ICD-10-CM

## 2024-07-29 MED ORDER — IOPAMIDOL (ISOVUE-200) INJECTION 41%
3.0000 mL | Freq: Once | INTRAVENOUS | Status: AC | PRN
  Administered 2024-07-29: 3 mL

## 2024-07-29 MED ORDER — METHYLPREDNISOLONE ACETATE 40 MG/ML INJ SUSP (RADIOLOG
120.0000 mg | Freq: Once | INTRAMUSCULAR | Status: AC
  Administered 2024-07-29: 120 mg via EPIDURAL

## 2024-09-01 ENCOUNTER — Emergency Department

## 2024-09-01 ENCOUNTER — Emergency Department
Admission: EM | Admit: 2024-09-01 | Discharge: 2024-09-01 | Disposition: A | Discharge status: Home / Self Care | Attending: Emergency Medicine | Admitting: Emergency Medicine

## 2024-09-01 DIAGNOSIS — R0789 Other chest pain: Secondary | ICD-10-CM | POA: Diagnosis present

## 2024-09-01 DIAGNOSIS — J189 Pneumonia, unspecified organism: Secondary | ICD-10-CM | POA: Insufficient documentation

## 2024-09-01 LAB — BASIC METABOLIC PANEL WITH GFR
Anion gap: 14 (ref 5–15)
BUN: 11 mg/dL (ref 6–20)
CO2: 20 mmol/L — ABNORMAL LOW (ref 22–32)
Calcium: 10.1 mg/dL (ref 8.9–10.3)
Chloride: 102 mmol/L (ref 98–111)
Creatinine, Ser: 0.8 mg/dL (ref 0.44–1.00)
GFR, Estimated: >60 mL/min (ref >60)
Glucose, Bld: 107 mg/dL — ABNORMAL HIGH (ref 70–99)
Potassium: 3.2 mmol/L — ABNORMAL LOW (ref 3.5–5.1)
Sodium: 136 mmol/L (ref 135–145)

## 2024-09-01 LAB — CBC
HCT: 47.9 % — ABNORMAL HIGH (ref 36.0–46.0)
Hemoglobin: 17 g/dL — ABNORMAL HIGH (ref 12.0–15.0)
MCH: 32.6 pg (ref 26.0–34.0)
MCHC: 35.5 g/dL (ref 30.0–36.0)
MCV: 91.8 fL (ref 80.0–100.0)
Platelets: 204 K/uL (ref 150–400)
RBC: 5.22 MIL/uL — ABNORMAL HIGH (ref 3.87–5.11)
RDW: 11.9 % (ref 11.5–15.5)
WBC: 4 K/uL (ref 4.0–10.5)
nRBC: 0 % (ref 0.0–0.2)

## 2024-09-01 LAB — TROPONIN I (HIGH SENSITIVITY): Troponin I (High Sensitivity): 3 ng/L (ref <18)

## 2024-09-01 MED ORDER — IOHEXOL 350 MG/ML SOLN
75.0000 mL | Freq: Once | INTRAVENOUS | Status: AC | PRN
  Administered 2024-09-01: 75 mL via INTRAVENOUS

## 2024-09-01 MED ORDER — ONDANSETRON HCL 4 MG/2ML IJ SOLN
4.0000 mg | Freq: Once | INTRAMUSCULAR | Status: AC
  Administered 2024-09-01: 4 mg via INTRAVENOUS
  Filled 2024-09-01: qty 2

## 2024-09-01 MED ORDER — CEPHALEXIN 500 MG PO CAPS: 500.0000 mg | ORAL_CAPSULE | Freq: Three times a day (TID) | ORAL | 0 refills | Status: AC

## 2024-09-01 MED ORDER — SODIUM CHLORIDE 0.9 % IV BOLUS
500.0000 mL | Freq: Once | INTRAVENOUS | Status: AC
  Administered 2024-09-01: 500 mL via INTRAVENOUS

## 2024-09-01 MED ORDER — MORPHINE SULFATE (PF) 4 MG/ML IV SOLN
4.0000 mg | Freq: Once | INTRAVENOUS | Status: AC
  Administered 2024-09-01: 4 mg via INTRAVENOUS
  Filled 2024-09-01: qty 1

## 2024-09-01 MED ORDER — AZITHROMYCIN 250 MG PO TABS: ORAL_TABLET | ORAL | 0 refills | Status: AC

## 2024-09-01 NOTE — ED Triage Notes (Signed)
 Pt presents to the ED via POV from home with pain between her shoulder blades, into her chest and down her left arm. Pt reports that this started on Friday. States that she feels a tight feeling around the circumference of her chest. Reports nausea and vomiting that started on Saturday.

## 2024-09-01 NOTE — ED Notes (Signed)
 Called CT to inform them that MD Arlander does not want to wait on urine.

## 2024-09-01 NOTE — ED Notes (Signed)
 Patient transported to CT

## 2024-09-01 NOTE — ED Provider Notes (Signed)
 [facility] Provider Note    Event Date/Time   First MD Initiated Contact with Patient 09/01/24 1122     (approximate)   History   Chest Pain   HPI  Gloria Dorsey is a 48 y.o. female with a history of GERD, Raynaud's disease, kyphoscoliosis who presents with complaints of back pain and chest pain with nausea and vomiting.  Patient reports she developed upper back pain several days ago which then began to radiate around the left upper back to her left chest and she feels a tightness in her chest.  Nausea and vomiting started thereafter.  No fevers reported.  No shortness of breath     Physical Exam   Triage Vital Signs: ED Triage Vitals  Encounter Vitals Group     BP 09/01/24 1117 127/74     Girls Systolic BP Percentile --      Girls Diastolic BP Percentile --      Boys Systolic BP Percentile --      Boys Diastolic BP Percentile --      Pulse Rate 09/01/24 1117 (!) 105     Resp 09/01/24 1117 18     Temp 09/01/24 1117 98.2 F (36.8 C)     Temp Source 09/01/24 1117 Oral     SpO2 09/01/24 1117 96 %     Weight 09/01/24 1116 74.8 kg (165 lb)     Height 09/01/24 1116 1.549 m (5' 1)     Head Circumference --      Peak Flow --      Pain Score 09/01/24 1116 5     Pain Loc --      Pain Education --      Exclude from Growth Chart --     Most recent vital signs: Vitals:   09/01/24 1117 09/01/24 1448  BP: 127/74 107/78  Pulse: (!) 105 79  Resp: 18 19  Temp: 98.2 F (36.8 C) 98.4 F (36.9 C)  SpO2: 96% 98%     General: Awake, no distress.  CV:  Good peripheral perfusion.  Regular rate and rhythm Resp:  Normal effort.  Clear to auscultation bilaterally Abd:  No distention.  Other:  Equal pulses in the upper extremities No rash  ED Results / Procedures / Treatments   Labs (all labs ordered are listed, but only abnormal results are displayed) Labs Reviewed  BASIC METABOLIC PANEL WITH GFR - Abnormal; Notable for the following components:       Result Value   Potassium 3.2 (*)    CO2 20 (*)    Glucose, Bld 107 (*)    All other components within normal limits  CBC - Abnormal; Notable for the following components:   RBC 5.22 (*)    Hemoglobin 17.0 (*)    HCT 47.9 (*)    All other components within normal limits  POC URINE PREG, ED  TROPONIN I (HIGH SENSITIVITY)     EKG  ED ECG REPORT I, Lamar Price, the attending physician, personally viewed and interpreted this ECG.  Date: 09/01/2024  Rhythm: Sinus tachycardia QRS Axis: normal Intervals: normal ST/T Wave abnormalities: None specific ST changes     RADIOLOGY Chest x-ray viewed interpret by me, no acute abnormality    PROCEDURES:  Critical Care performed:   Procedures   MEDICATIONS ORDERED IN ED: Medications  morphine  (PF) 4 MG/ML injection 4 mg (4 mg Intravenous Given 09/01/24 1207)  ondansetron  (ZOFRAN ) injection 4 mg (4 mg Intravenous Given 09/01/24 1207)  sodium chloride  0.9 % bolus 500 mL (0 mLs Intravenous Stopped 09/01/24 1328)  iohexol (OMNIPAQUE) 350 MG/ML injection 75 mL (75 mLs Intravenous Contrast Given 09/01/24 1358)     IMPRESSION / MDM / ASSESSMENT AND PLAN / ED COURSE  I reviewed the triage vital signs and the nursing notes. Patient's presentation is most consistent with acute presentation with potential threat to life or bodily function.  Patient presents with back pain and chest pain as detailed above with nausea vomiting.  Differential includes AAS, ACS, musculoskeletal pain, esophagitis  Will treat with IV morphine , IV Zofran , IV fluids.  High sensitive troponin is reassuring, lab work is overall unremarkable, will send for CT angiography of the chest stat  CT scan does not demonstrate any evidence of AAS but is suspicious for early bronchopneumonia, will start the patient on p.o. antibiotics.  No indication for admission at this time, she will follow-up closely with PCP, strict return precautions, she and her husband agree  with this plan.      FINAL CLINICAL IMPRESSION(S) / ED DIAGNOSES   Final diagnoses:  Community acquired pneumonia of right lung, unspecified part of lung     Rx / DC Orders   ED Discharge Orders          Ordered    azithromycin  (ZITHROMAX  Z-PAK) 250 MG tablet        09/01/24 1537    cephALEXin (KEFLEX) 500 MG capsule  3 times daily        09/01/24 1537             Note:  This document was prepared using Dragon voice recognition software and may include unintentional dictation errors.   Arlander Charleston, MD 09/01/24 1537

## 2024-10-08 ENCOUNTER — Other Ambulatory Visit: Payer: Self-pay | Admitting: Family Medicine

## 2024-10-08 ENCOUNTER — Ambulatory Visit
Admission: RE | Admit: 2024-10-08 | Discharge: 2024-10-08 | Disposition: A | Discharge status: Home / Self Care | Source: Ambulatory Visit | Attending: Family Medicine | Admitting: Family Medicine

## 2024-10-08 DIAGNOSIS — Z8701 Personal history of pneumonia (recurrent): Secondary | ICD-10-CM

## 2024-11-11 ENCOUNTER — Other Ambulatory Visit: Payer: Self-pay | Admitting: Family Medicine

## 2024-11-11 DIAGNOSIS — Q675 Congenital deformity of spine: Secondary | ICD-10-CM

## 2024-11-11 DIAGNOSIS — M5416 Radiculopathy, lumbar region: Secondary | ICD-10-CM

## 2024-12-04 ENCOUNTER — Ambulatory Visit
Admission: RE | Admit: 2024-12-04 | Discharge: 2024-12-04 | Disposition: A | Source: Ambulatory Visit | Attending: Family Medicine | Admitting: Family Medicine

## 2024-12-04 DIAGNOSIS — Q675 Congenital deformity of spine: Secondary | ICD-10-CM

## 2024-12-04 DIAGNOSIS — M5416 Radiculopathy, lumbar region: Secondary | ICD-10-CM

## 2024-12-04 MED ORDER — METHYLPREDNISOLONE ACETATE 40 MG/ML INJ SUSP (RADIOLOG
80.0000 mg | Freq: Once | INTRAMUSCULAR | Status: AC
  Administered 2024-12-04: 80 mg via EPIDURAL

## 2024-12-04 MED ORDER — IOPAMIDOL (ISOVUE-M 200) INJECTION 41%
3.0000 mL | Freq: Once | INTRAMUSCULAR | Status: AC
  Administered 2024-12-04: 3 mL via EPIDURAL

## 2024-12-04 MED ORDER — LIDOCAINE 1 % OPTIME INJ - NO CHARGE
5.0000 mL | Freq: Once | INTRAMUSCULAR | Status: AC
  Administered 2024-12-04: 5 mL
